# Patient Record
Sex: Female | Born: 1975 | Race: White | Hispanic: No | Marital: Single | Smoking: Never smoker
Health system: Southern US, Community
[De-identification: ages and names within clinical notes are randomized; demographics above are authoritative.]

## PROBLEM LIST (undated history)

## (undated) ENCOUNTER — Inpatient Hospital Stay (HOSPITAL_COMMUNITY): Payer: Self-pay

## (undated) DIAGNOSIS — M545 Low back pain, unspecified: Secondary | ICD-10-CM

## (undated) DIAGNOSIS — E538 Deficiency of other specified B group vitamins: Secondary | ICD-10-CM

## (undated) DIAGNOSIS — R32 Unspecified urinary incontinence: Secondary | ICD-10-CM

## (undated) DIAGNOSIS — N393 Stress incontinence (female) (male): Secondary | ICD-10-CM

## (undated) DIAGNOSIS — R200 Anesthesia of skin: Secondary | ICD-10-CM

## (undated) DIAGNOSIS — M7710 Lateral epicondylitis, unspecified elbow: Secondary | ICD-10-CM

## (undated) DIAGNOSIS — I340 Nonrheumatic mitral (valve) insufficiency: Secondary | ICD-10-CM

## (undated) DIAGNOSIS — F419 Anxiety disorder, unspecified: Secondary | ICD-10-CM

## (undated) DIAGNOSIS — E559 Vitamin D deficiency, unspecified: Secondary | ICD-10-CM

## (undated) DIAGNOSIS — F329 Major depressive disorder, single episode, unspecified: Secondary | ICD-10-CM

## (undated) DIAGNOSIS — R202 Paresthesia of skin: Secondary | ICD-10-CM

## (undated) DIAGNOSIS — I5032 Chronic diastolic (congestive) heart failure: Secondary | ICD-10-CM

## (undated) DIAGNOSIS — F32A Depression, unspecified: Secondary | ICD-10-CM

## (undated) DIAGNOSIS — E785 Hyperlipidemia, unspecified: Secondary | ICD-10-CM

## (undated) DIAGNOSIS — I80209 Phlebitis and thrombophlebitis of unspecified deep vessels of unspecified lower extremity: Secondary | ICD-10-CM

## (undated) HISTORY — DX: Paresthesia of skin: R20.0

## (undated) HISTORY — DX: Paresthesia of skin: R20.2

## (undated) HISTORY — DX: Deficiency of other specified B group vitamins: E53.8

## (undated) HISTORY — PX: INTESTINAL MALROTATION REPAIR: SHX411

## (undated) HISTORY — DX: Vitamin D deficiency, unspecified: E55.9

## (undated) HISTORY — DX: Chronic diastolic (congestive) heart failure: I50.32

## (undated) HISTORY — DX: Major depressive disorder, single episode, unspecified: F32.9

## (undated) HISTORY — DX: Stress incontinence (female) (male): N39.3

## (undated) HISTORY — DX: Anxiety disorder, unspecified: F41.9

## (undated) HISTORY — DX: Phlebitis and thrombophlebitis of unspecified deep vessels of unspecified lower extremity: I80.209

## (undated) HISTORY — PX: DERMOID CYST  EXCISION: SHX1452

## (undated) HISTORY — PX: CHOLECYSTECTOMY: SHX55

## (undated) HISTORY — DX: Depression, unspecified: F32.A

## (undated) HISTORY — DX: Low back pain, unspecified: M54.50

## (undated) HISTORY — DX: Lateral epicondylitis, unspecified elbow: M77.10

## (undated) HISTORY — DX: Hyperlipidemia, unspecified: E78.5

## (undated) HISTORY — DX: Nonrheumatic mitral (valve) insufficiency: I34.0

## (undated) HISTORY — PX: OTHER SURGICAL HISTORY: SHX169

## (undated) HISTORY — PX: APPENDECTOMY: SHX54

---
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
## 2006-08-09 ENCOUNTER — Emergency Department (HOSPITAL_COMMUNITY): Admission: EM | Admit: 2006-08-09 | Discharge: 2006-08-09 | Payer: Self-pay | Admitting: Emergency Medicine

## 2006-09-02 ENCOUNTER — Ambulatory Visit: Payer: Self-pay | Admitting: Gastroenterology

## 2006-09-15 ENCOUNTER — Ambulatory Visit: Payer: Self-pay | Admitting: Gastroenterology

## 2007-12-13 ENCOUNTER — Emergency Department: Payer: Self-pay | Admitting: Emergency Medicine

## 2008-03-03 ENCOUNTER — Observation Stay (HOSPITAL_COMMUNITY): Admission: EM | Admit: 2008-03-03 | Discharge: 2008-03-03 | Payer: Self-pay | Admitting: Emergency Medicine

## 2010-04-23 LAB — POCT I-STAT, CHEM 8
Calcium, Ion: 1.04 mmol/L — ABNORMAL LOW (ref 1.12–1.32)
Creatinine, Ser: 0.7 mg/dL (ref 0.4–1.2)
Glucose, Bld: 140 mg/dL — ABNORMAL HIGH (ref 70–99)
HCT: 42 % (ref 36.0–46.0)
Hemoglobin: 14.3 g/dL (ref 12.0–15.0)

## 2010-04-23 LAB — URINALYSIS, ROUTINE W REFLEX MICROSCOPIC
Ketones, ur: 15 mg/dL — AB
Leukocytes, UA: NEGATIVE
Nitrite: NEGATIVE
Protein, ur: NEGATIVE mg/dL
Urobilinogen, UA: 1 mg/dL (ref 0.0–1.0)

## 2010-04-23 LAB — URINE MICROSCOPIC-ADD ON

## 2010-04-23 LAB — HEPATIC FUNCTION PANEL
Bilirubin, Direct: 0.3 mg/dL (ref 0.0–0.3)
Indirect Bilirubin: 0.6 mg/dL (ref 0.3–0.9)
Total Bilirubin: 0.9 mg/dL (ref 0.3–1.2)

## 2010-06-09 ENCOUNTER — Emergency Department (HOSPITAL_COMMUNITY)
Admission: EM | Admit: 2010-06-09 | Discharge: 2010-06-09 | Disposition: A | Discharge status: Home / Self Care | Payer: Medicaid Other | Attending: Emergency Medicine | Admitting: Emergency Medicine

## 2010-06-09 DIAGNOSIS — Z0000 Encounter for general adult medical examination without abnormal findings: Secondary | ICD-10-CM | POA: Insufficient documentation

## 2010-06-09 DIAGNOSIS — R4182 Altered mental status, unspecified: Secondary | ICD-10-CM | POA: Insufficient documentation

## 2010-06-09 DIAGNOSIS — F1210 Cannabis abuse, uncomplicated: Secondary | ICD-10-CM | POA: Insufficient documentation

## 2010-10-14 ENCOUNTER — Ambulatory Visit: Payer: Self-pay

## 2010-10-21 LAB — URINALYSIS, ROUTINE W REFLEX MICROSCOPIC
Hgb urine dipstick: NEGATIVE
Ketones, ur: NEGATIVE
Protein, ur: NEGATIVE
Urobilinogen, UA: 0.2

## 2010-10-21 LAB — HEPATIC FUNCTION PANEL
ALT: 39 — ABNORMAL HIGH
Bilirubin, Direct: <0.1
Total Protein: 7.8

## 2010-10-21 LAB — LIPASE, BLOOD: Lipase: 24

## 2010-10-21 LAB — I-STAT 8, (EC8 V) (CONVERTED LAB)
Acid-Base Excess: 2
Bicarbonate: 28.5 — ABNORMAL HIGH
Chloride: 103
HCT: 43
Operator id: 277751
TCO2: 30
pCO2, Ven: 53 — ABNORMAL HIGH
pH, Ven: 7.338 — ABNORMAL HIGH

## 2010-10-21 LAB — POCT I-STAT CREATININE
Creatinine, Ser: 0.7
Operator id: 277751

## 2010-10-21 LAB — DIFFERENTIAL
Basophils Absolute: 0
Basophils Relative: 1
Eosinophils Relative: 3
Lymphocytes Relative: 39
Monocytes Absolute: 0.5

## 2010-10-21 LAB — PREGNANCY, URINE: Preg Test, Ur: NEGATIVE

## 2010-10-21 LAB — CBC
HCT: 41.1
Hemoglobin: 14
MCHC: 34
Platelets: 314
RDW: 13.5

## 2010-11-07 ENCOUNTER — Ambulatory Visit: Payer: Self-pay

## 2010-11-28 ENCOUNTER — Observation Stay: Payer: Self-pay | Admitting: Obstetrics and Gynecology

## 2010-12-03 ENCOUNTER — Inpatient Hospital Stay: Payer: Self-pay

## 2010-12-06 LAB — PATHOLOGY REPORT

## 2011-11-03 ENCOUNTER — Emergency Department: Payer: Self-pay | Admitting: Emergency Medicine

## 2011-11-26 ENCOUNTER — Other Ambulatory Visit: Payer: Self-pay | Admitting: Neurological Surgery

## 2011-11-26 DIAGNOSIS — M545 Low back pain: Secondary | ICD-10-CM

## 2011-12-02 ENCOUNTER — Ambulatory Visit
Admission: RE | Admit: 2011-12-02 | Discharge: 2011-12-02 | Disposition: A | Discharge status: Home / Self Care | Payer: Medicaid Other | Source: Ambulatory Visit | Attending: Neurological Surgery | Admitting: Neurological Surgery

## 2011-12-02 DIAGNOSIS — M545 Low back pain: Secondary | ICD-10-CM

## 2012-03-21 ENCOUNTER — Emergency Department: Payer: Self-pay | Admitting: Unknown Physician Specialty

## 2012-03-21 LAB — CBC
HCT: 38.5 % (ref 35.0–47.0)
HGB: 13.1 g/dL (ref 12.0–16.0)
MCH: 28.2 pg (ref 26.0–34.0)
RDW: 12.7 % (ref 11.5–14.5)
WBC: 9.1 10*3/uL (ref 3.6–11.0)

## 2012-03-21 LAB — BASIC METABOLIC PANEL
BUN: 10 mg/dL (ref 7–18)
Calcium, Total: 8.8 mg/dL (ref 8.5–10.1)
Creatinine: 0.56 mg/dL — ABNORMAL LOW (ref 0.60–1.30)
EGFR (African American): >60
EGFR (Non-African Amer.): >60
Glucose: 102 mg/dL — ABNORMAL HIGH (ref 65–99)
Potassium: 3.4 mmol/L — ABNORMAL LOW (ref 3.5–5.1)

## 2012-03-21 LAB — CK TOTAL AND CKMB (NOT AT ARMC)
CK, Total: 81 U/L (ref 21–215)
CK-MB: 1 ng/mL (ref 0.5–3.6)

## 2012-10-18 ENCOUNTER — Ambulatory Visit: Payer: Self-pay | Admitting: Obstetrics and Gynecology

## 2012-10-18 LAB — HEMOGLOBIN: HGB: 13.1 g/dL (ref 12.0–16.0)

## 2012-10-28 ENCOUNTER — Ambulatory Visit: Payer: Self-pay | Admitting: Obstetrics and Gynecology

## 2012-10-29 LAB — PATHOLOGY REPORT

## 2012-12-15 ENCOUNTER — Ambulatory Visit: Payer: Self-pay | Admitting: Nurse Practitioner

## 2013-06-11 ENCOUNTER — Emergency Department: Payer: Self-pay | Admitting: Emergency Medicine

## 2013-06-11 LAB — URINALYSIS, COMPLETE
BACTERIA: NONE SEEN
BILIRUBIN, UR: NEGATIVE
BLOOD: NEGATIVE
GLUCOSE, UR: NEGATIVE mg/dL (ref 0–75)
Ketone: NEGATIVE
Leukocyte Esterase: NEGATIVE
Nitrite: NEGATIVE
Ph: 5 (ref 4.5–8.0)
Protein: NEGATIVE
Specific Gravity: 1.023 (ref 1.003–1.030)
Squamous Epithelial: 4

## 2013-06-11 LAB — CBC WITH DIFFERENTIAL/PLATELET
Basophil #: 0.1 10*3/uL (ref 0.0–0.1)
Basophil %: 0.8 %
Eosinophil #: 0.1 10*3/uL (ref 0.0–0.7)
Eosinophil %: 1.6 %
HCT: 40.9 % (ref 35.0–47.0)
HGB: 13.8 g/dL (ref 12.0–16.0)
LYMPHS PCT: 27.7 %
Lymphocyte #: 2.4 10*3/uL (ref 1.0–3.6)
MCH: 29.3 pg (ref 26.0–34.0)
MCHC: 33.7 g/dL (ref 32.0–36.0)
MCV: 87 fL (ref 80–100)
Monocyte #: 0.5 x10 3/mm (ref 0.2–0.9)
Monocyte %: 6 %
NEUTROS ABS: 5.5 10*3/uL (ref 1.4–6.5)
NEUTROS PCT: 63.9 %
Platelet: 292 10*3/uL (ref 150–440)
RBC: 4.7 10*6/uL (ref 3.80–5.20)
RDW: 13.4 % (ref 11.5–14.5)
WBC: 8.6 10*3/uL (ref 3.6–11.0)

## 2013-06-11 LAB — COMPREHENSIVE METABOLIC PANEL
ALBUMIN: 3.6 g/dL (ref 3.4–5.0)
ALK PHOS: 70 U/L
ALT: 22 U/L (ref 12–78)
AST: 9 U/L — AB (ref 15–37)
Anion Gap: 8 (ref 7–16)
BUN: 11 mg/dL (ref 7–18)
Bilirubin,Total: 0.3 mg/dL (ref 0.2–1.0)
CHLORIDE: 103 mmol/L (ref 98–107)
CO2: 27 mmol/L (ref 21–32)
CREATININE: 0.61 mg/dL (ref 0.60–1.30)
Calcium, Total: 8.6 mg/dL (ref 8.5–10.1)
EGFR (African American): >60
Glucose: 100 mg/dL — ABNORMAL HIGH (ref 65–99)
OSMOLALITY: 275 (ref 275–301)
Potassium: 3.4 mmol/L — ABNORMAL LOW (ref 3.5–5.1)
SODIUM: 138 mmol/L (ref 136–145)
Total Protein: 7.5 g/dL (ref 6.4–8.2)

## 2013-06-11 LAB — HCG, QUANTITATIVE, PREGNANCY: BETA HCG, QUANT.: 284 m[IU]/mL — AB

## 2013-06-13 ENCOUNTER — Emergency Department: Payer: Self-pay | Admitting: Emergency Medicine

## 2013-06-13 LAB — HCG, QUANTITATIVE, PREGNANCY: Beta Hcg, Quant.: 687 m[IU]/mL — ABNORMAL HIGH

## 2013-06-16 ENCOUNTER — Emergency Department: Payer: Self-pay

## 2013-06-16 LAB — CBC WITH DIFFERENTIAL/PLATELET
Basophil #: 0.1 10*3/uL (ref 0.0–0.1)
Basophil %: 0.7 %
EOS PCT: 1.3 %
Eosinophil #: 0.1 10*3/uL (ref 0.0–0.7)
HCT: 38.9 % (ref 35.0–47.0)
HGB: 13.3 g/dL (ref 12.0–16.0)
Lymphocyte #: 2.2 10*3/uL (ref 1.0–3.6)
Lymphocyte %: 28.6 %
MCH: 29.7 pg (ref 26.0–34.0)
MCHC: 34.1 g/dL (ref 32.0–36.0)
MCV: 87 fL (ref 80–100)
MONOS PCT: 5.3 %
Monocyte #: 0.4 x10 3/mm (ref 0.2–0.9)
Neutrophil #: 5 10*3/uL (ref 1.4–6.5)
Neutrophil %: 64.1 %
Platelet: 276 10*3/uL (ref 150–440)
RBC: 4.47 10*6/uL (ref 3.80–5.20)
RDW: 13.1 % (ref 11.5–14.5)
WBC: 7.8 10*3/uL (ref 3.6–11.0)

## 2013-06-16 LAB — COMPREHENSIVE METABOLIC PANEL
ALBUMIN: 3.6 g/dL (ref 3.4–5.0)
AST: 20 U/L (ref 15–37)
Alkaline Phosphatase: 61 U/L
Anion Gap: 5 — ABNORMAL LOW (ref 7–16)
BUN: 8 mg/dL (ref 7–18)
Bilirubin,Total: 0.4 mg/dL (ref 0.2–1.0)
CALCIUM: 8.6 mg/dL (ref 8.5–10.1)
CO2: 26 mmol/L (ref 21–32)
Chloride: 106 mmol/L (ref 98–107)
Creatinine: 0.83 mg/dL (ref 0.60–1.30)
EGFR (African American): >60
EGFR (Non-African Amer.): >60
Glucose: 107 mg/dL — ABNORMAL HIGH (ref 65–99)
Osmolality: 273 (ref 275–301)
Potassium: 3.5 mmol/L (ref 3.5–5.1)
SGPT (ALT): 21 U/L (ref 12–78)
Sodium: 137 mmol/L (ref 136–145)
Total Protein: 7.1 g/dL (ref 6.4–8.2)

## 2013-06-16 LAB — URINALYSIS, COMPLETE
BLOOD: NEGATIVE
Bilirubin,UR: NEGATIVE
Glucose,UR: NEGATIVE mg/dL (ref 0–75)
KETONE: NEGATIVE
NITRITE: NEGATIVE
PROTEIN: NEGATIVE
Ph: 5 (ref 4.5–8.0)
RBC,UR: <1 /HPF (ref 0–5)
Specific Gravity: 1.019 (ref 1.003–1.030)
Squamous Epithelial: 13

## 2013-06-16 LAB — GC/CHLAMYDIA PROBE AMP

## 2013-06-16 LAB — HCG, QUANTITATIVE, PREGNANCY: Beta Hcg, Quant.: 2306 m[IU]/mL — ABNORMAL HIGH

## 2013-06-16 LAB — WET PREP, GENITAL

## 2013-08-01 ENCOUNTER — Ambulatory Visit: Payer: Self-pay | Admitting: Nurse Practitioner

## 2013-08-03 ENCOUNTER — Encounter (HOSPITAL_COMMUNITY): Payer: Self-pay | Admitting: *Deleted

## 2013-08-03 ENCOUNTER — Inpatient Hospital Stay (HOSPITAL_COMMUNITY)
Admission: AD | Admit: 2013-08-03 | Discharge: 2013-08-04 | Disposition: A | Discharge status: Home / Self Care | Payer: Medicaid Other | Source: Ambulatory Visit | Attending: Obstetrics & Gynecology | Admitting: Obstetrics & Gynecology

## 2013-08-03 DIAGNOSIS — O219 Vomiting of pregnancy, unspecified: Secondary | ICD-10-CM

## 2013-08-03 DIAGNOSIS — R51 Headache: Secondary | ICD-10-CM | POA: Insufficient documentation

## 2013-08-03 DIAGNOSIS — O210 Mild hyperemesis gravidarum: Secondary | ICD-10-CM | POA: Diagnosis present

## 2013-08-03 DIAGNOSIS — K5900 Constipation, unspecified: Secondary | ICD-10-CM | POA: Insufficient documentation

## 2013-08-03 NOTE — MAU Note (Signed)
Vomiting with preg

## 2013-08-04 ENCOUNTER — Encounter (HOSPITAL_COMMUNITY): Payer: Self-pay | Admitting: *Deleted

## 2013-08-04 DIAGNOSIS — O219 Vomiting of pregnancy, unspecified: Secondary | ICD-10-CM

## 2013-08-04 LAB — URINALYSIS, ROUTINE W REFLEX MICROSCOPIC
BILIRUBIN URINE: NEGATIVE
Glucose, UA: NEGATIVE mg/dL
Hgb urine dipstick: NEGATIVE
Ketones, ur: 40 mg/dL — AB
Leukocytes, UA: NEGATIVE
NITRITE: NEGATIVE
Protein, ur: NEGATIVE mg/dL
SPECIFIC GRAVITY, URINE: 1.015 (ref 1.005–1.030)
UROBILINOGEN UA: 0.2 mg/dL (ref 0.0–1.0)
pH: 8.5 — ABNORMAL HIGH (ref 5.0–8.0)

## 2013-08-04 LAB — POCT PREGNANCY, URINE: PREG TEST UR: POSITIVE — AB

## 2013-08-04 MED ORDER — PROMETHAZINE HCL 25 MG/ML IJ SOLN
25.0000 mg | Freq: Once | INTRAVENOUS | Status: DC
  Filled 2013-08-04: qty 1

## 2013-08-04 MED ORDER — DEXTROSE 5 % IN LACTATED RINGERS IV BOLUS: 1000.0000 mL | Freq: Once | INTRAVENOUS | Status: DC

## 2013-08-04 MED ORDER — ONDANSETRON 8 MG PO TBDP: 8.0000 mg | ORAL_TABLET | Freq: Three times a day (TID) | ORAL | Status: DC | PRN

## 2013-08-04 MED ORDER — ONDANSETRON 8 MG/NS 50 ML IVPB: 8.0000 mg | Freq: Once | INTRAVENOUS | Status: DC

## 2013-08-04 MED ORDER — PROMETHAZINE HCL 25 MG/ML IJ SOLN
25.0000 mg | Freq: Once | INTRAVENOUS | Status: DC
  Administered 2013-08-04: 25 mg via INTRAVENOUS
  Filled 2013-08-04: qty 1

## 2013-08-04 MED ORDER — DOCUSATE SODIUM 100 MG PO CAPS: 100.0000 mg | ORAL_CAPSULE | Freq: Two times a day (BID) | ORAL | Status: DC

## 2013-08-04 NOTE — MAU Provider Note (Signed)
History     CSN: [id number]  Arrival date and time: 08/03/13 2303   First Provider Initiated Contact with Patient 08/04/13 0019      Chief Complaint  Patient presents with  . Hyperemesis Gravidarum  . Constipation  . Headache   HPI  [name] Velazquez is a 38 y.o. [id number] at [redacted]w[redacted]d who presents today with nausea and vomiting. She is getting prenatal care at [facility]. She has been taking phenergan, but it has not been helping. She states that she has vomited about 4 times today, and has not been able to eat anything today. She has not had a BM in about 5 days either.   Past Medical History  Diagnosis Date  . Medical history non-contributory     Past Surgical History  Procedure Laterality Date  . Appendectomy    . Cholecystectomy    . Abdominal surgery      History reviewed. No pertinent family history.  History  Substance Use Topics  . Smoking status: Never Smoker   . Smokeless tobacco: Not on file  . Alcohol Use: No    Allergies: No Known Allergies  Prescriptions prior to admission  Medication Sig Dispense Refill  . magnesium citrate SOLN Take 296 mLs by mouth once.      . promethazine (PHENERGAN) 25 MG suppository Place 25 mg rectally every 6 (six) hours as needed for nausea or vomiting.      . promethazine (PHENERGAN) 25 MG tablet Take 25 mg by mouth every 6 (six) hours as needed for nausea or vomiting.        ROS Physical Exam   Blood pressure 123/80, pulse 85, temperature 99 F (37.2 C), temperature source Oral, resp. rate 16, height 5\' 2"  (1.575 m), weight 98.431 kg (217 lb), SpO2 100.00%.  Physical Exam  Nursing note and vitals reviewed. Constitutional: She is oriented to person, place, and time. She appears well-developed and well-nourished. No distress.  Cardiovascular: Normal rate.   Respiratory: Effort normal.  GI: Soft. There is no tenderness. There is no rebound.  Neurological: She is alert and oriented to person, place, and time.  Skin:  Skin is warm and dry.  Psychiatric: She has a normal mood and affect.    MAU Course  Procedures  Results for orders placed during the hospital encounter of 08/03/13 (from the past 24 hour(s))  URINALYSIS, ROUTINE W REFLEX MICROSCOPIC     Status: Abnormal   Collection Time    08/03/13 11:30 PM      Result Value Ref Range   Color, Urine YELLOW  YELLOW   APPearance HAZY (*) CLEAR   Specific Gravity, Urine 1.015  1.005 - 1.030   pH 8.5 (*) 5.0 - 8.0   Glucose, UA NEGATIVE  NEGATIVE mg/dL   Hgb urine dipstick NEGATIVE  NEGATIVE   Bilirubin Urine NEGATIVE  NEGATIVE   Ketones, ur 40 (*) NEGATIVE mg/dL   Protein, ur NEGATIVE  NEGATIVE mg/dL   Urobilinogen, UA 0.2  0.0 - 1.0 mg/dL   Nitrite NEGATIVE  NEGATIVE   Leukocytes, UA NEGATIVE  NEGATIVE   0216: Patient has been able to tolerate PO food and fluids   Assessment and Plan   1. Nausea/vomiting in pregnancy    Comfort measures reviewed Return to MAU as needed  Follow-up Information   Follow up with Consuella Lose . (As scheduled)    Contact information:   [address] [place], [place] [address] Phone [phone]        Mathews Robinsons,  Franco ColletHeather Donovan 08/04/2013, 12:20 AM

## 2013-08-04 NOTE — Discharge Instructions (Signed)

## 2013-08-06 ENCOUNTER — Ambulatory Visit: Payer: Self-pay | Admitting: Nurse Practitioner

## 2013-08-09 NOTE — MAU Provider Note (Signed)
Attestation of Attending Supervision of Advanced Practitioner: Evaluation and management procedures were performed by the PA/NP/CNM/OB Fellow under my supervision/collaboration. Chart reviewed and agree with management and plan.  Denver Bentson V 08/09/2013 7:14 AM    

## 2013-09-23 ENCOUNTER — Encounter (HOSPITAL_COMMUNITY): Payer: Self-pay | Admitting: *Deleted

## 2013-09-23 ENCOUNTER — Inpatient Hospital Stay (HOSPITAL_COMMUNITY)
Admission: AD | Admit: 2013-09-23 | Discharge: 2013-09-23 | Disposition: A | Discharge status: Home / Self Care | Payer: Medicaid Other | Source: Ambulatory Visit | Attending: Obstetrics and Gynecology | Admitting: Obstetrics and Gynecology

## 2013-09-23 DIAGNOSIS — O99340 Other mental disorders complicating pregnancy, unspecified trimester: Secondary | ICD-10-CM | POA: Diagnosis not present

## 2013-09-23 DIAGNOSIS — F418 Other specified anxiety disorders: Secondary | ICD-10-CM

## 2013-09-23 DIAGNOSIS — O26899 Other specified pregnancy related conditions, unspecified trimester: Secondary | ICD-10-CM

## 2013-09-23 DIAGNOSIS — F411 Generalized anxiety disorder: Secondary | ICD-10-CM | POA: Diagnosis present

## 2013-09-23 DIAGNOSIS — R109 Unspecified abdominal pain: Secondary | ICD-10-CM | POA: Diagnosis not present

## 2013-09-23 LAB — URINALYSIS, ROUTINE W REFLEX MICROSCOPIC
BILIRUBIN URINE: NEGATIVE
GLUCOSE, UA: NEGATIVE mg/dL
KETONES UR: NEGATIVE mg/dL
LEUKOCYTES UA: NEGATIVE
Nitrite: NEGATIVE
PROTEIN: NEGATIVE mg/dL
Specific Gravity, Urine: 1.015 (ref 1.005–1.030)
Urobilinogen, UA: 0.2 mg/dL (ref 0.0–1.0)
pH: 5.5 (ref 5.0–8.0)

## 2013-09-23 LAB — URINE MICROSCOPIC-ADD ON
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
## 2013-09-23 NOTE — MAU Note (Signed)
Patient states she has been under a lot of stress for the past 2 days and feels anxious. Feels like she had an emotional breakdown. States she has abdominal cramping, denies bleeding or discharge. Patient gets her prenatal care in [place].

## 2013-09-23 NOTE — MAU Provider Note (Signed)
Attestation of Attending Supervision of Advanced Practitioner (CNM/NP): Evaluation and management procedures were performed by the Advanced Practitioner under my supervision and collaboration.  I have reviewed the Advanced Practitioner's note and chart, and I agree with the management and plan.  Chastin Riesgo 09/23/2013 7:57 PM

## 2013-09-23 NOTE — Discharge Instructions (Signed)
It is important that you follow up with a therapist to prescribe you medications for your anxiety. I did speak with Dr. Jolayne Panther and she says that they are accepting new patients in the [facility] office here at [facility]. You will need to call to schedule an appointment. If you do keep your appointment for Monday with your OB they may be able to refer you to a therapist or if you follow up here the staff in the [facility] office can arrange an appointment for you.
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
## 2013-11-07 ENCOUNTER — Encounter (HOSPITAL_COMMUNITY): Payer: Self-pay | Admitting: *Deleted

## 2014-02-13 ENCOUNTER — Ambulatory Visit: Payer: Self-pay | Admitting: Obstetrics and Gynecology

## 2014-02-13 LAB — CBC WITH DIFFERENTIAL/PLATELET
Basophil #: 0 10*3/uL (ref 0.0–0.1)
Basophil %: 0.2 %
EOS ABS: 0.1 10*3/uL (ref 0.0–0.7)
Eosinophil %: 1.3 %
HCT: 32.8 % — ABNORMAL LOW (ref 35.0–47.0)
HGB: 11 g/dL — ABNORMAL LOW (ref 12.0–16.0)
LYMPHS PCT: 18.7 %
Lymphocyte #: 1.4 10*3/uL (ref 1.0–3.6)
MCH: 27.3 pg (ref 26.0–34.0)
MCHC: 33.6 g/dL (ref 32.0–36.0)
MCV: 81 fL (ref 80–100)
MONO ABS: 0.5 x10 3/mm (ref 0.2–0.9)
MONOS PCT: 6.7 %
Neutrophil #: 5.6 10*3/uL (ref 1.4–6.5)
Neutrophil %: 73.1 %
PLATELETS: 195 10*3/uL (ref 150–440)
RBC: 4.03 10*6/uL (ref 3.80–5.20)
RDW: 13.8 % (ref 11.5–14.5)
WBC: 7.7 10*3/uL (ref 3.6–11.0)

## 2014-02-14 ENCOUNTER — Inpatient Hospital Stay: Payer: Self-pay | Admitting: Obstetrics & Gynecology

## 2014-04-28 NOTE — Op Note (Signed)
PATIENT NAMArtemio Velazquez:  [name] Velazquez, [name] Velazquez MR#:  [id number] DATE OF BIRTH:  30-Dec-1975  DATE OF PROCEDURE:  10/28/2012  PREOPERATIVE DIAGNOSES: 1.  Ovarian cysts. 2.  Pelvic pain.  POSTOPERATIVE DIAGNOSES: 1.  Ovarian cysts. 2.  Pelvic pain. 3.  Postoperative evidence of endometriosis. 4.  Cyst consistent with a functional cyst or serous cyst.  OPERATION PERFORMED:  Laparoscopic right ovarian cystectomy.   ANESTHESIA USED: General.   PRIMARY SURGEON: Vena AustriaAndreas Jelissa Espiritu, MD   ESTIMATED BLOOD LOSS: Minimal.  OPERATIVE FLUIDS: 1 liter of crystalloid.  URINE OUTPUT: 400 mL of clear urine.  COMPLICATIONS: None.   INTRAOPERATIVE FINDINGS: The right ovary and tube adhesed to pelvic sidewall with thin,  filmy adhesions.  Some evidence of possible endometriosis in the right pelvic wall versus reactive changes.  Both tubes grossly normal in appearance. Left ovary was normal/ Right ovary contained two simple serous cysts.   SPECIMENS REMOVED:  Portions of right ovarian cyst wall, right pelvic sidewall, peritoneal biopsies.    CONDITION FOLLOWING PROCEDURE: Stable.  PROCEDURE IN DETAIL: The risks, benefits, and alternatives of the procedure were discussed with the patient prior to proceeding to the operating room.  The patient was taken to the operating room, where she was placed under general endotracheal anesthesia. She was prepped and draped in the usual sterile fashion, positioned in the dorsal lithotomy position.   Attention was turned to the patient's pelvis. An operative speculum was placed. The cervix was visualized.  The anterior lip grasped with a single-tooth tenaculum.  A Hulka tenaculum was then placed, allowing manipulation of the uterus.The single tooth tenaculum and speculum were removed. The patient's bladder was previously drained with a red rubber straight catheter.   Attention was turned to the patient's abdomen.  The umbilicus was infiltrated with 1% lidocaine.  A stab incision was  then made at the base of the umbilicus, entry to  the peritoneal cavity was obtained using direct visualization and an XL trocar.  Once a pneumoperitoneum had been established, a 10 mm left assistant port was placed under direct visualization, and a right 5 mm assistant port. Inspection of the abdomen and pelvis revealed the above findings.  The adhesions of the ovary to the sidewall and to the tube were freed up bluntly.  Once the ovary was mobilized, the cyst on the right ovary was incised, using the 5 mm Harmonic. The cyst wall was then removed.  Following removal of the cyst wall, the surgical bed was noted to be hemostatic.  There was some evidence of endometriosis involving the right pelvic sidewall, several slightly rust-colored lesions were dispersed throughout the right sidewall. Several of these were biopsied and sent to pathology.  Following this, the pneumoperitoneum was evacuated. All port sites were removed. The 10 mm port site was closed with a 4-0 Monocryl in subcuticular fashion. The remaining port sites were dressed with Dermabond. Sponge, needle, and instrument counts were correct x2. The patient tolerated the procedure well, and was taken to the recovery room in stable condition.   ____________________________ Florina OuAndreas M. Bonney AidStaebler, MD ams:cg D: 10/31/2012 22:43:00 ET T: 11/01/2012 03:58:06 ET JOB#: [id number]  cc: Florina OuAndreas M. Bonney AidStaebler, MD, <Dictator> Carmel SacramentoANDREAS Cathrine MusterM Emmaleah Meroney MD ELECTRONICALLY SIGNED 11/17/2012 8:56

## 2014-05-01 LAB — SURGICAL PATHOLOGY

## 2014-05-03 ENCOUNTER — Encounter: Payer: Self-pay | Admitting: Gastroenterology

## 2014-05-07 NOTE — Op Note (Signed)
PATIENT NAMArtemio Aly:  Velazquez, Velazquez MR#:  [id number] DATE OF BIRTH:  1975-08-04  DATE OF PROCEDURE:  02/14/2014  PREOPERATIVE DIAGNOSES: 761.  A 39 year old gravida 5, para 4-0-0-4 at 40 weeks, 0 days gestation. 2.  History of prior low transverse cesarean section.  3.  Gestational diabetes.  4.  Morbid obesity with body mass index 40. 5.  Desires permanent surgical sterilization.   POSTOPERATIVE DIAGNOSES: 241.  A 39 year old gravida 5, para 4-0-0-4 at 40 weeks, 0 days gestation. 2.  History of prior low transverse cesarean section.  3.  Gestational diabetes.  4.  Morbid obesity with body mass index 40. 5.  Desires permanent surgical sterilization.   PROCEDURE PERFORMED: Repeat low transverse cesarean section via Pfannenstiel skin incision and bilateral tubal ligation with Pomeroy method.   ANESTHESIA USED: Spinal.   PRIMARY SURGEON: Vena AustriaAndreas Kismet Facemire, MD  ASSISTANT: Chelsea Ward, MD  PREOPERATIVE ANTIBIOTICS: 2 grams Ancef.   ESTIMATED BLOOD LOSS: 800 mL.  OPERATIVE FLUIDS: 1500 mL of crystalloid.   URINE OUTPUT: 25 mL.   COMPLICATIONS: None.   SPECIMENS REMOVED: Portion of right and left tube.   INTRAOPERATIVE FINDINGS: Normal tubes, ovaries, and uterus. A nuchal cord x1 was encountered during delivery of the infant. Delivery resulted in the birth of a liveborn female infant weighing 3450 grams, 7 pounds 10 ounces, Apgars 9 and 9.   PATIENT CONDITION FOLLOWING PROCEDURE: Stable.   PROCEDURE IN DETAIL: Risks, benefits, and alternatives of the procedure were discussed with the patient prior to proceeding to the operating room. The patient was taken to the operating room where she was placed under spinal anesthesia. She was positioned in the supine position and prepped and draped in the usual sterile fashion. The level of anesthetic was checked prior to proceeding with the case and a timeout was performed. A Pfannenstiel skin incision was made 2 cm above the pubic symphysis utilizing  the patient's previous existing scar. This was carried down sharply to the level of the rectus fascia. The fascia was incised in the midline using the scalpel. The fascial incision was extended using Mayo scissors. Superior border was grasped with 2 Kocher clamps and the fascia was dissected off the underlying rectus muscles using a combination of blunt dissection and sharp dissection using the scalpel. The inferior border of the rectus fascia was dissected off the rectus muscles in a similar fashion and the median raphe incised using Mayo scissors. The midline was identified and had been entered during taking down the fascia from the rectus muscles. That incision was extended using manual traction. Bladder blade was placed. A bladder flap was then created using Metzenbaum scissors, further developed digitally before replacing the bladder to displace the bladder caudally. A low transverse incision was scored on the uterus. The hysterotomy was entered using the operator's finger and then extended using manual traction. Amniotomy was performed noting clear fluid. The vertex was grasped, flexed, brought to the incision, delivered atraumatically using fundal pressure. The remainder of the infant delivered with ease. Cord was clamped and infant was suctioned before being passed to the awaiting pediatricians. The placenta was delivered using manual extraction. The uterus was exteriorized and wiped clean of clot and debris and closed using a single layer of 0 Vicryl. A portion of the right tube was identified in the mid isthmic portion, grasped with a Babcock clamp, doubly suture ligated using a 0 chromic  (Dictation Anomaly) knuckle of tube was resected using a Pomeroy method. The left portion of the tube was  also dissected in a similar fashion. The uterus was then returned to the abdomen. The hysterotomy incision was inspected and noted to be hemostatic as were both tubal segments. The rectus muscles were reapproximated  in the midline using a 2-0 Vicryl mattress stitch. The On-Q catheters were placed subfascially per the usual protocol. The fascia was closed using a looped #1 PDS in a running fashion. The subcutaneous tissue was irrigated and hemostasis achieved using the Bovie. The skin was closed using staples. Sponge, needle and instrument counts were correct x2. The patient tolerated the procedure well and was taken to the recovery room in stable condition.    ____________________________ Velazquez Ou. Bonney Aid, MD ams:sb D: 02/21/2014 08:27:28 ET T: 02/21/2014 10:15:56 ET JOB#: [id number]  cc: Velazquez Ou. Bonney Aid, MD, <Dictator> Velazquez Reid MD ELECTRONICALLY SIGNED 03/14/2014 20:58

## 2014-05-24 ENCOUNTER — Encounter: Payer: Self-pay | Admitting: Gastroenterology

## 2014-05-24 ENCOUNTER — Ambulatory Visit (INDEPENDENT_AMBULATORY_CARE_PROVIDER_SITE_OTHER): Payer: Medicaid Other | Admitting: Gastroenterology

## 2014-05-24 VITALS — BP 120/74 | HR 84 | Ht 62.0 in | Wt 213.0 lb

## 2014-05-24 DIAGNOSIS — A0472 Enterocolitis due to Clostridium difficile, not specified as recurrent: Secondary | ICD-10-CM | POA: Insufficient documentation

## 2014-05-24 DIAGNOSIS — A047 Enterocolitis due to Clostridium difficile: Secondary | ICD-10-CM

## 2014-05-24 MED ORDER — HYOSCYAMINE SULFATE 0.125 MG SL SUBL: 0.2500 mg | SUBLINGUAL_TABLET | SUBLINGUAL | Status: DC | PRN

## 2014-05-24 NOTE — Progress Notes (Signed)
    _                                                                                                                History of Present Illness:  [name] is a 39 year old white female referred at the request of Dr. Park BreedKahn for evaluation of diarrhea.  She gave birth in February, 2016.  She received antibiotics for a wound infection.  Following this she developed diarrhea, passage of mucousy stools and blood.  She apparently tested positive for C. difficile and was placed on Flagyl for 2 weeks.  When symptoms did not improve she was given another 2 week course of Flagyl, again without improvement.  Finally, she was placed on vancomycin.  Diarrhea did not subside until she completed the two-week course of vancomycin.  At this point she has rumbling in her abdomen and excess gas but no diarrhea.  There is no history of antecedent GI problems.   Past Medical History  Diagnosis Date  . Gestational diabetes   . Depression    Past Surgical History  Procedure Laterality Date  . Appendectomy    . Cholecystectomy    . Intestinal malrotation repair    . Cesarean section     family history includes Breast cancer in her maternal grandmother; Hypertension in her mother; Multiple sclerosis in her father; Obesity in her mother. Current Outpatient Prescriptions  Medication Sig Dispense Refill  . buPROPion (WELLBUTRIN XL) 150 MG 24 hr tablet Take 150 mg by mouth daily.     No current facility-administered medications for this visit.   Allergies as of 05/24/2014  . (No Known Allergies)    reports that she has never smoked. She has never used smokeless tobacco. She reports that she does not drink alcohol or use illicit drugs.   Review of Systems: Pertinent positive and negative review of systems were noted in the above HPI section. All other review of systems were otherwise negative.  Vital signs were reviewed in today's medical record Physical Exam: General: Obese female in no acute  distress Skin: anicteric Head: Normocephalic and atraumatic Eyes:  sclerae anicteric, EOMI Ears: Normal auditory acuity Mouth: No deformity or lesions Neck: Supple, no masses or thyromegaly Lymph Nodes: no lymphadenopathy Lungs: Clear throughout to auscultation Heart: Regular rate and rhythm; no murmurs, rubs or bruits Gastroinestinal: Soft, non tender and non distended. No masses, hepatosplenomegaly or hernias noted. Normal Bowel sounds Rectal:deferred Musculoskeletal: Symmetrical with no gross deformities  Skin: No lesions on visible extremities Pulses:  Normal pulses noted Extremities: No clubbing, cyanosis, edema or deformities noted Neurological: Alert oriented x 4, grossly nonfocal Cervical Nodes:  No significant cervical adenopathy Inguinal Nodes: No significant inguinal adenopathy Psychological:  Alert and cooperative. Normal mood and affect  See Assessment and Plan under Problem List

## 2014-05-24 NOTE — Assessment & Plan Note (Signed)
I history appears that she may have had pseudomembranous colitis that only responded to vancomycin rather than recurrent ear infections.  Post infectious colitis is also a possibility.  At this time she has no diarrhea although she is complaining of some abdominal discomfort and excess gas.  I would not be surprised if she develops diarrhea again.  Recommendations #1 attempt to obtain records of her previous stool studies #2 Vernona RiegerLaura Q1 tab twice a day for 2 weeks #3 hyomax when necessary #4 to consider sigmoidoscopy or repeat stool C. difficile toxin if she develops recurrent diarrhea  CC Dr. Park BreedKahn

## 2014-05-24 NOTE — Patient Instructions (Signed)
Use FloraQ over the counter 1 by mouth twice a day for 14 days Your prescription will be sent to your pharmacy  (Hyomax) Follow up as needed

## 2014-08-16 ENCOUNTER — Encounter
Admission: RE | Admit: 2014-08-16 | Discharge: 2014-08-16 | Disposition: A | Discharge status: Home / Self Care | Payer: Medicaid Other | Source: Ambulatory Visit | Attending: Obstetrics and Gynecology | Admitting: Obstetrics and Gynecology

## 2014-08-16 DIAGNOSIS — Z01812 Encounter for preprocedural laboratory examination: Secondary | ICD-10-CM | POA: Diagnosis not present

## 2014-08-16 HISTORY — DX: Unspecified urinary incontinence: R32

## 2014-08-16 NOTE — Patient Instructions (Signed)
  Your procedure is scheduled on: 08/24/14 Thurs  Report to Day Surgery. To find out your arrival time please call [phone] between 1PM - 3PM on 08/23/14 Wed.  Remember: Instructions that are not followed completely may result in serious medical risk, up to and including death, or upon the discretion of your surgeon and anesthesiologist your surgery may need to be rescheduled.    __x__ 1. Do not eat food or drink liquids after midnight. No gum chewing or hard candies.     ____ 2. No Alcohol for 24 hours before or after surgery.   ____ 3. Bring all medications with you on the day of surgery if instructed.    __x__ 4. Notify your doctor if there is any change in your medical condition     (cold, fever, infections).     Do not wear jewelry, make-up, hairpins, clips or nail polish.  Do not wear lotions, powders, or perfumes. You may wear deodorant.  Do not shave 48 hours prior to surgery. Men may shave face and neck.  Do not bring valuables to the hospital.    [facility] is not responsible for any belongings or valuables.               Contacts, dentures or bridgework may not be worn into surgery.  Leave your suitcase in the car. After surgery it may be brought to your room.  For patients admitted to the hospital, discharge time is determined by your                treatment team.   Patients discharged the day of surgery will not be allowed to drive home.   Please read over the following fact sheets that you were given:      ____ Take these medicines the morning of surgery with A SIP OF WATER:    1.   2.   3.   4.  5.  6.  ____ Fleet Enema (as directed)   ___ Use CHG Soap as directed  ____ Use inhalers on the day of surgery  ____ Stop metformin 2 days prior to surgery    ____ Take 1/2 of usual insulin dose the night before surgery and none on the morning of surgery.   ____ Stop Coumadin/Plavix/aspirin on   ____ Stop Anti-inflammatories on    ____ Stop supplements  until after surgery.    ____ Bring C-Pap to the hospital.

## 2014-08-24 ENCOUNTER — Encounter
Admission: RE | Disposition: A | Discharge status: Home / Self Care | Payer: Self-pay | Source: Ambulatory Visit | Attending: Obstetrics and Gynecology

## 2014-08-24 ENCOUNTER — Ambulatory Visit: Payer: Medicaid Other | Admitting: Anesthesiology

## 2014-08-24 ENCOUNTER — Encounter: Payer: Self-pay | Admitting: *Deleted

## 2014-08-24 ENCOUNTER — Ambulatory Visit
Admission: RE | Admit: 2014-08-24 | Discharge: 2014-08-24 | Disposition: A | Discharge status: Home / Self Care | Payer: Medicaid Other | Source: Ambulatory Visit | Attending: Obstetrics and Gynecology | Admitting: Obstetrics and Gynecology

## 2014-08-24 DIAGNOSIS — E669 Obesity, unspecified: Secondary | ICD-10-CM | POA: Diagnosis not present

## 2014-08-24 DIAGNOSIS — Z8249 Family history of ischemic heart disease and other diseases of the circulatory system: Secondary | ICD-10-CM | POA: Diagnosis not present

## 2014-08-24 DIAGNOSIS — Z6838 Body mass index (BMI) 38.0-38.9, adult: Secondary | ICD-10-CM | POA: Insufficient documentation

## 2014-08-24 DIAGNOSIS — N393 Stress incontinence (female) (male): Secondary | ICD-10-CM | POA: Diagnosis present

## 2014-08-24 DIAGNOSIS — Z9049 Acquired absence of other specified parts of digestive tract: Secondary | ICD-10-CM | POA: Diagnosis not present

## 2014-08-24 DIAGNOSIS — Z79899 Other long term (current) drug therapy: Secondary | ICD-10-CM | POA: Diagnosis not present

## 2014-08-24 HISTORY — PX: PUBOVAGINAL SLING: SHX1035

## 2014-08-24 LAB — POCT PREGNANCY, URINE: Preg Test, Ur: NEGATIVE

## 2014-08-24 SURGERY — CREATION, PUBOVAGINAL SLING
Anesthesia: General

## 2014-08-24 MED ORDER — FENTANYL CITRATE (PF) 100 MCG/2ML IJ SOLN
25.0000 ug | INTRAMUSCULAR | Status: AC | PRN
  Administered 2014-08-24 (×6): 25 ug via INTRAVENOUS

## 2014-08-24 MED ORDER — FENTANYL CITRATE (PF) 100 MCG/2ML IJ SOLN
INTRAMUSCULAR | Status: AC
  Administered 2014-08-24: 25 ug via INTRAVENOUS
  Filled 2014-08-24: qty 2

## 2014-08-24 MED ORDER — NEOMYCIN-POLYMYXIN B GU 40-200000 IR SOLN
Status: DC | PRN
  Administered 2014-08-24: 1 mL

## 2014-08-24 MED ORDER — NEOMYCIN-POLYMYXIN B GU 40-200000 IR SOLN
Status: AC
  Filled 2014-08-24: qty 1

## 2014-08-24 MED ORDER — SODIUM CHLORIDE 0.9 % IV SOLN: INTRAVENOUS | Status: DC

## 2014-08-24 MED ORDER — FENTANYL CITRATE (PF) 100 MCG/2ML IJ SOLN
INTRAMUSCULAR | Status: DC | PRN
  Administered 2014-08-24 (×3): 50 ug via INTRAVENOUS

## 2014-08-24 MED ORDER — LIDOCAINE HCL (PF) 1 % IJ SOLN
INTRAMUSCULAR | Status: AC
  Filled 2014-08-24: qty 30

## 2014-08-24 MED ORDER — OXYCODONE-ACETAMINOPHEN 5-325 MG PO TABS
ORAL_TABLET | ORAL | Status: AC
  Filled 2014-08-24: qty 2

## 2014-08-24 MED ORDER — FAMOTIDINE 20 MG PO TABS
20.0000 mg | ORAL_TABLET | Freq: Once | ORAL | Status: AC
  Administered 2014-08-24: 20 mg via ORAL

## 2014-08-24 MED ORDER — ESTROGENS, CONJUGATED 0.625 MG/GM VA CREA
TOPICAL_CREAM | VAGINAL | Status: AC
  Filled 2014-08-24: qty 30

## 2014-08-24 MED ORDER — LACTATED RINGERS IV SOLN
INTRAVENOUS | Status: DC
  Administered 2014-08-24 (×2): via INTRAVENOUS

## 2014-08-24 MED ORDER — ONDANSETRON HCL 4 MG/2ML IJ SOLN
INTRAMUSCULAR | Status: DC | PRN
  Administered 2014-08-24: 4 mg via INTRAVENOUS

## 2014-08-24 MED ORDER — LIDOCAINE HCL (CARDIAC) 20 MG/ML IV SOLN
INTRAVENOUS | Status: DC | PRN
  Administered 2014-08-24: 60 mg via INTRAVENOUS

## 2014-08-24 MED ORDER — PROPOFOL 10 MG/ML IV BOLUS
INTRAVENOUS | Status: DC | PRN
  Administered 2014-08-24: 160 mg via INTRAVENOUS

## 2014-08-24 MED ORDER — PHENYLEPHRINE HCL 10 MG/ML IJ SOLN
INTRAMUSCULAR | Status: DC | PRN
  Administered 2014-08-24 (×2): 100 ug via INTRAVENOUS

## 2014-08-24 MED ORDER — OXYCODONE-ACETAMINOPHEN 5-325 MG PO TABS
1.0000 | ORAL_TABLET | ORAL | Status: DC | PRN
  Administered 2014-08-24: 2 via ORAL

## 2014-08-24 MED ORDER — DEXAMETHASONE SODIUM PHOSPHATE 4 MG/ML IJ SOLN
INTRAMUSCULAR | Status: DC | PRN
  Administered 2014-08-24 (×2): 5 mg via INTRAVENOUS

## 2014-08-24 MED ORDER — CEFAZOLIN SODIUM-DEXTROSE 2-3 GM-% IV SOLR
2.0000 g | Freq: Once | INTRAVENOUS | Status: AC
  Administered 2014-08-24: 2 g via INTRAVENOUS

## 2014-08-24 MED ORDER — CEFAZOLIN SODIUM-DEXTROSE 2-3 GM-% IV SOLR
INTRAVENOUS | Status: AC
  Administered 2014-08-24: 2 g via INTRAVENOUS
  Filled 2014-08-24: qty 50

## 2014-08-24 MED ORDER — LIDOCAINE-EPINEPHRINE 1 %-1:100000 IJ SOLN
INTRAMUSCULAR | Status: DC | PRN
  Administered 2014-08-24: 16 mL

## 2014-08-24 MED ORDER — IBUPROFEN 600 MG PO TABS: 600.0000 mg | ORAL_TABLET | Freq: Four times a day (QID) | ORAL | Status: DC | PRN

## 2014-08-24 MED ORDER — ROCURONIUM BROMIDE 100 MG/10ML IV SOLN
INTRAVENOUS | Status: DC | PRN
  Administered 2014-08-24: 40 mg via INTRAVENOUS

## 2014-08-24 MED ORDER — FAMOTIDINE 20 MG PO TABS
ORAL_TABLET | ORAL | Status: AC
  Administered 2014-08-24: 20 mg via ORAL
  Filled 2014-08-24: qty 1

## 2014-08-24 MED ORDER — OXYCODONE-ACETAMINOPHEN 5-325 MG PO TABS: 1.0000 | ORAL_TABLET | Freq: Four times a day (QID) | ORAL | Status: DC | PRN

## 2014-08-24 MED ORDER — ONDANSETRON HCL 4 MG/2ML IJ SOLN: 4.0000 mg | Freq: Once | INTRAMUSCULAR | Status: DC | PRN

## 2014-08-24 MED ORDER — MIDAZOLAM HCL 2 MG/2ML IJ SOLN
INTRAMUSCULAR | Status: DC | PRN
  Administered 2014-08-24: 2 mg via INTRAVENOUS

## 2014-08-24 SURGICAL SUPPLY — 36 items
BAG URO DRAIN 2000ML W/SPOUT (MISCELLANEOUS) ×3 IMPLANT
BLADE SURG 15 STRL LF DISP TIS (BLADE) ×1 IMPLANT
BLADE SURG 15 STRL SS (BLADE) ×2
BNDG GAUZE 4.5X4.1 6PLY STRL (MISCELLANEOUS) IMPLANT
CANISTER SUCT 1200ML W/VALVE (MISCELLANEOUS) ×3 IMPLANT
CATH FOLEY 2WAY  5CC 16FR (CATHETERS) ×2
CATH URTH 16FR FL 2W BLN LF (CATHETERS) ×1 IMPLANT
COUNTER NEEDLE 20/40 LG (NEEDLE) ×3 IMPLANT
GLOVE BIO SURGEON STRL SZ7 (GLOVE) ×3 IMPLANT
GLOVE INDICATOR 7.5 STRL GRN (GLOVE) ×6 IMPLANT
GOWN STRL REUS W/ TWL LRG LVL3 (GOWN DISPOSABLE) ×2 IMPLANT
GOWN STRL REUS W/TWL LRG LVL3 (GOWN DISPOSABLE) ×4
HANDLE YANKAUER SUCT BULB TIP (MISCELLANEOUS) ×3 IMPLANT
IV LACTATED RINGERS 1000ML (IV SOLUTION) ×3 IMPLANT
KIT RM TURNOVER CYSTO AR (KITS) ×6 IMPLANT
LIQUID BAND (GAUZE/BANDAGES/DRESSINGS) ×6 IMPLANT
NDL SAFETY 18GX1.5 (NEEDLE) ×3 IMPLANT
NDL SAFETY 22GX1.5 (NEEDLE) ×3 IMPLANT
NS IRRIG 500ML POUR BTL (IV SOLUTION) ×3 IMPLANT
PACK DNC HYST (MISCELLANEOUS) ×3 IMPLANT
PAD GROUND ADULT SPLIT (MISCELLANEOUS) ×3 IMPLANT
PAD OB MATERNITY 4.3X12.25 (PERSONAL CARE ITEMS) ×3 IMPLANT
PAD PREP 24X41 OB/GYN DISP (PERSONAL CARE ITEMS) ×3 IMPLANT
PENCIL ELECTRO HAND CTR (MISCELLANEOUS) ×3 IMPLANT
SLING TVT EXACT (Sling) ×3 IMPLANT
SUT VIC AB 0 CT1 27 (SUTURE)
SUT VIC AB 0 CT1 27XCR 8 STRN (SUTURE) IMPLANT
SUT VIC AB 2-0 CT1 (SUTURE) IMPLANT
SUT VIC AB 3-0 SH 27 (SUTURE) ×2
SUT VIC AB 3-0 SH 27X BRD (SUTURE) ×1 IMPLANT
SUT VICRYL+ 3-0 36IN CT-1 (SUTURE) IMPLANT
SYR CONTROL 10ML (SYRINGE) ×3 IMPLANT
SYRINGE 10CC LL (SYRINGE) ×3 IMPLANT
SYRINGE IRR TOOMEY STRL 70CC (SYRINGE) ×3 IMPLANT
TUBING CONNECTING 10 (TUBING) ×2 IMPLANT
TUBING CONNECTING 10' (TUBING) ×1

## 2014-08-24 NOTE — Transfer of Care (Signed)
Immediate Anesthesia Transfer of Care Note  Patient: [name] Velazquez  Procedure(s) Performed: Procedure(s): PUBO-VAGINAL SLING (N/A)  Patient Location: PACU  Anesthesia Type:General  Level of Consciousness: awake, alert , oriented and patient cooperative  Airway & Oxygen Therapy: Patient Spontanous Breathing and Patient connected to face mask oxygen  Post-op Assessment: Report given to RN, Post -op Vital signs reviewed and stable and Patient moving all extremities X 4  Post vital signs: Reviewed and stable  Last Vitals:  Filed Vitals:   08/24/14 1109  BP: 101/81  Pulse: 102  Temp: 36.1 C  Resp: 19    Complications: No apparent anesthesia complications

## 2014-08-24 NOTE — Progress Notes (Signed)
Pt arrives from OR with oral airway intact. 

## 2014-08-24 NOTE — H&P (Signed)
Date of Initial paper H&P  History reviewed, patient examined, no change in status, stable for surgery. 

## 2014-08-24 NOTE — Progress Notes (Signed)
Pt awakened oral airway removed intact. 

## 2014-08-24 NOTE — Op Note (Signed)
Patient Name: Bhavya Eschete  Date of Procedure: 08/24/2014   Preoperative Diagnosis: 1) 39 y.o. with stress urinary incontinence  Postoperative Diagnosis: 1) 39 y.o. with stress urinary incontinence  Operation Performed: TVT and cystoscopy  Indication: Patient is a 39 year old [id number] done with child bearing with symptoms of stress urinary incontinence desiring surgical management.  Anesthesia: General  Primary Surgeon: Vena Austria, MD  Assistant: none  Preoperative Antibiotics: none  Estimated Blood Loss: 25mL  IV Fluids:  Urine Output:: ~552mL  Drains or Tubes: none  Implants: TVT exact  Specimens Removed: enone  Complications: none  Intraoperative Findings:  Cystoscopy at the conclusion of the case revealed intact bladder dome, no perforation by the TVT arms of the bladder neck or urethra.  Patient Condition: stable  Procedure in Detail:  Patient was taken to the operating room were she was administered general endotracheal anesthesia.  She was positioned in the dorsal lithotomy position utilizing Allen stirups, prepped and draped in the usual sterile fashion.  Prior to proceeding with the case a time out was performed.  Attention was turned to the patient's pelvis.  A indwelling foley catheter was used to decompress the patient's bladder.  An short weighted speculum was placed to allow visualization of the anterior vaginal wall.  An Alice clamp was placed on the anterior vaginal wall 1.5cm distal to the the urethral meatus.  The retropubic space was hydro dissected using 16mL of 1% Sensorcaine.   An incision was made with the 11 scalpel and carried cephelad for approximately 2cm from the previously placed Alice clamp.  The space between the urethra and retropubic space was further dissected using Metzenbaum scissor .  Once sufficient space had been mobilized the pubic symphysis was marked in the midline and two mark 2.5cm laterally were made on the patient  mons to guide the TVT trochar.  A guide was placed in the bladder to allow deflection of the bladder away from the trochars.  The tip of the trochar was moved along the pubic bone, staying in contact with the pubic bone as the trochar was moved through the retropubic space and towards the corresponding guide marks previously made.  Once this was accomplished bilaterally, cystoscopy was performed noting no defect in the bladder or urethra from the trochars.  The Mesh was adjusted so that it sat snugg but tension free beneath the urethra with enough room to comfortable accommodate a tonsil with some surrounding free space.  The sleeves were removed off the TVT mesh with the tonsil held in place to verify the mesh did not snug up any further.    The vaginal incision was closed using a 3-0 Monocryl in a running fashion.  Foley was removed before leaving the OR.  Sponge needle and instrument counts were correct times two.

## 2014-08-24 NOTE — Anesthesia Procedure Notes (Signed)
Procedure Name: Intubation Date/Time: 08/24/2014 9:42 AM Performed by: Michaele Offer Pre-anesthesia Checklist: Patient identified, Emergency Drugs available, Suction available and Patient being monitored Patient Re-evaluated:Patient Re-evaluated prior to inductionOxygen Delivery Method: Circle system utilized Preoxygenation: Pre-oxygenation with 100% oxygen Intubation Type: IV induction Ventilation: Oral airway inserted - appropriate to patient size and Mask ventilation with difficulty Laryngoscope Size: Mac and 3 Grade View: Grade II Tube type: Oral Tube size: 7.0 mm Number of attempts: 1 Airway Equipment and Method: Patient positioned with wedge pillow and Stylet Placement Confirmation: ETT inserted through vocal cords under direct vision,  positive ETCO2,  CO2 detector and breath sounds checked- equal and bilateral Secured at: 20 cm Tube secured with: Tape Dental Injury: Bloody posterior oropharynx  Difficulty Due To: Difficulty was unanticipated

## 2014-08-24 NOTE — Discharge Instructions (Signed)
-   Call for heavy vaginal bleeding, you may have some light bleeding or spotting - Call for inability to void, you may have been discharged with a foley catheter if you were unable to void in the hospital which will be removed at your 1 week postop visit - Call for fevers (T >100.65F) - Call for pain not covered by your prescription narcotics - No baths, showers in the next 6 weeks - No driving while taking prescription narcotics - No heavy lifting greater than 10lbs for 6 weeks as the sling heals, you also want to avoid constipation,  Any straining may affect position of the sling and long term outcome

## 2014-08-24 NOTE — Anesthesia Postprocedure Evaluation (Signed)
  Anesthesia Post-op Note  Patient: [name] Velazquez  Procedure(s) Performed: Procedure(s): PUBO-VAGINAL SLING (N/A)  Anesthesia type:General  Patient location: PACU  Post pain: Pain level controlled  Post assessment: Post-op Vital signs reviewed, Patient's Cardiovascular Status Stable, Respiratory Function Stable, Patent Airway and No signs of Nausea or vomiting  Post vital signs: Reviewed and stable  Last Vitals:  Filed Vitals:   08/24/14 1148  BP:   Pulse: 78  Temp:   Resp: 13    Level of consciousness: awake, alert  and patient cooperative  Complications: No apparent anesthesia complications

## 2014-08-24 NOTE — Anesthesia Preprocedure Evaluation (Addendum)
Anesthesia Evaluation  Patient identified by MRN, date of birth, ID band Patient awake    Reviewed: Allergy & Precautions, NPO status , Patient's Chart, lab work & pertinent test results  Airway Mallampati: II       Dental no notable dental hx.    Pulmonary neg pulmonary ROS,    Pulmonary exam normal       Cardiovascular negative cardio ROS Normal cardiovascular exam    Neuro/Psych    GI/Hepatic negative GI ROS, Neg liver ROS,   Endo/Other  diabetes  Renal/GU negative Renal ROS     Musculoskeletal negative musculoskeletal ROS (+)   Abdominal (+) + obese,   Peds negative pediatric ROS (+)  Hematology negative hematology ROS (+)   Anesthesia Other Findings   Reproductive/Obstetrics                            Anesthesia Physical Anesthesia Plan  ASA: II  Anesthesia Plan: General   Post-op Pain Management:    Induction: Intravenous  Airway Management Planned: LMA and Oral ETT  Additional Equipment:   Intra-op Plan:   Post-operative Plan: Extubation in OR  Informed Consent: I have reviewed the patients History and Physical, chart, labs and discussed the procedure including the risks, benefits and alternatives for the proposed anesthesia with the patient or authorized representative who has indicated his/her understanding and acceptance.     Plan Discussed with: CRNA  Anesthesia Plan Comments:         Anesthesia Quick Evaluation

## 2015-05-08 ENCOUNTER — Encounter (HOSPITAL_COMMUNITY): Payer: Self-pay

## 2015-05-08 ENCOUNTER — Emergency Department (HOSPITAL_COMMUNITY): Payer: Medicaid Other

## 2015-05-08 DIAGNOSIS — Z8659 Personal history of other mental and behavioral disorders: Secondary | ICD-10-CM | POA: Insufficient documentation

## 2015-05-08 DIAGNOSIS — R0602 Shortness of breath: Secondary | ICD-10-CM | POA: Insufficient documentation

## 2015-05-08 DIAGNOSIS — R112 Nausea with vomiting, unspecified: Secondary | ICD-10-CM | POA: Insufficient documentation

## 2015-05-08 DIAGNOSIS — R079 Chest pain, unspecified: Secondary | ICD-10-CM | POA: Diagnosis not present

## 2015-05-08 LAB — CBC
HCT: 39.8 % (ref 36.0–46.0)
Hemoglobin: 13.3 g/dL (ref 12.0–15.0)
MCH: 28 pg (ref 26.0–34.0)
MCHC: 33.4 g/dL (ref 30.0–36.0)
MCV: 83.8 fL (ref 78.0–100.0)
PLATELETS: 307 10*3/uL (ref 150–400)
RBC: 4.75 MIL/uL (ref 3.87–5.11)
RDW: 12 % (ref 11.5–15.5)
WBC: 6.8 10*3/uL (ref 4.0–10.5)

## 2015-05-08 LAB — I-STAT TROPONIN, ED: TROPONIN I, POC: 0 ng/mL (ref 0.00–0.08)

## 2015-05-08 LAB — BASIC METABOLIC PANEL
Anion gap: 11 (ref 5–15)
BUN: 8 mg/dL (ref 6–20)
CALCIUM: 9.3 mg/dL (ref 8.9–10.3)
CO2: 23 mmol/L (ref 22–32)
CREATININE: 0.78 mg/dL (ref 0.44–1.00)
Chloride: 105 mmol/L (ref 101–111)
Glucose, Bld: 134 mg/dL — ABNORMAL HIGH (ref 65–99)
Potassium: 3.6 mmol/L (ref 3.5–5.1)
SODIUM: 139 mmol/L (ref 135–145)

## 2015-05-08 NOTE — ED Notes (Signed)
Patient here with 1 month of chest heaviness. Has been seen by cardiology and has had stress test for same. Reports that she is scheduled to have CT-A tomorrow. Today chest heaviness worse with nausea and vomiting x 2. Requesting further evaluation. Light headiness with same

## 2015-05-09 ENCOUNTER — Emergency Department (HOSPITAL_COMMUNITY)
Admission: EM | Admit: 2015-05-09 | Discharge: 2015-05-09 | Disposition: A | Discharge status: Home / Self Care | Payer: Medicaid Other | Attending: Emergency Medicine | Admitting: Emergency Medicine

## 2015-05-09 DIAGNOSIS — R079 Chest pain, unspecified: Secondary | ICD-10-CM

## 2015-05-09 NOTE — ED Provider Notes (Signed)
CSN: [id number]     Arrival date & time 05/08/15  1842 History   First MD Initiated Contact with Patient 05/09/15 0029     Chief Complaint  Patient presents with  . Chest Pain     (Consider location/radiation/quality/duration/timing/severity/associated sxs/prior Treatment) HPI Comments: Patient with history of depression presents with complaint of progressively worsening chest discomfort described as burning type pain, intermittent, with associated SOB. She states that one month ago when symptoms began her episodes of pain were infrequent and are now occuring throughout the day. No aggravating or alleviating factors. She was seen by her doctor 2 weeks ago and referred to cardiology who performed a treadmill stress test and echo. She reports she was told her study showed diastolic dysfunction and she also states she was referred for a CTA chest, scheduled tomorrow. She states the pain was more frequent today prompting ED evaluation.  Patient is a 40 y.o. female presenting with chest pain. The history is provided by the patient. No language interpreter was used.  Chest Pain Pain location:  L chest Pain quality: aching and burning   Pain radiates to:  Does not radiate Pain radiates to the back: no   Pain severity:  Moderate Duration:  1 month Timing:  Intermittent Progression:  Worsening Associated symptoms: nausea, shortness of breath and vomiting   Associated symptoms: no fever     Past Medical History  Diagnosis Date  . Depression   . Bladder leak    Past Surgical History  Procedure Laterality Date  . Appendectomy    . Cholecystectomy    . Intestinal malrotation repair    . Cesarean section    . Lads    . Dermoid cyst  excision    . Pubovaginal sling N/A 08/24/2014    Procedure: Leonides Grills;  Surgeon: Vena Austria, MD;  Location: [facility] ORS;  Service: Gynecology;  Laterality: N/A;   Family History  Problem Relation Age of Onset  . Hypertension Mother   . Breast  cancer Maternal Grandmother   . Obesity Mother   . Multiple sclerosis Father    Social History  Substance Use Topics  . Smoking status: Never Smoker   . Smokeless tobacco: Never Used  . Alcohol Use: No   OB History    Gravida Para Term Preterm AB TAB SAB Ectopic Multiple Living   Review of Systems  Constitutional: Negative for fever and chills.  HENT: Negative.   Respiratory: Positive for shortness of breath.   Cardiovascular: Positive for chest pain.  Gastrointestinal: Positive for nausea and vomiting.  Musculoskeletal: Negative.  Negative for myalgias.  Skin: Negative.   Neurological: Negative.       Allergies  Review of patient's allergies indicates no known allergies.  Home Medications   Prior to Admission medications   Not on File   BP 107/79 mmHg  Pulse 76  Temp(Src) 98.7 F (37.1 C) (Oral)  Resp 15  Ht  (1.575 m)  Wt 97.523 kg  BMI 39.31 kg/m2  SpO2 100%  LMP 04/23/2015 Physical Exam  Constitutional: She is oriented to person, place, and time. She appears well-developed and well-nourished.  HENT:  Head: Normocephalic.  Neck: Normal range of motion. Neck supple.  Cardiovascular: Normal rate and regular rhythm.   Pulmonary/Chest: Effort normal and breath sounds normal. She has no wheezes. She has no rales. She exhibits no tenderness.  Abdominal: Soft. Bowel sounds are normal. There  is no tenderness. There is no rebound and no guarding.  Musculoskeletal: Normal range of motion. She exhibits no edema.  Neurological: She is alert and oriented to person, place, and time.  Skin: Skin is warm and dry. No rash noted.  Psychiatric: She has a normal mood and affect.    ED Course  Procedures (including critical care time) Labs Review Labs Reviewed  BASIC METABOLIC PANEL - Abnormal; Notable for the following:    Glucose, Bld 134 (*)    All other components within normal limits  CBC  I-STAT TROPOININ, ED   Results for orders placed  or performed during the hospital encounter of 05/09/15  Basic metabolic panel  Result Value Ref Range   Sodium 139 135 - 145 mmol/L   Potassium 3.6 3.5 - 5.1 mmol/L   Chloride 105 101 - 111 mmol/L   CO2 23 22 - 32 mmol/L   Glucose, Bld 134 (H) 65 - 99 mg/dL   BUN 8 6 - 20 mg/dL   Creatinine, Ser 1.610.78 0.44 - 1.00 mg/dL   Calcium 9.3 8.9 - 09.610.3 mg/dL   GFR calc non Af Amer >60 >60 mL/min   GFR calc Af Amer >60 >60 mL/min   Anion gap 11 5 - 15  CBC  Result Value Ref Range   WBC 6.8 4.0 - 10.5 K/uL   RBC 4.75 3.87 - 5.11 MIL/uL   Hemoglobin 13.3 12.0 - 15.0 g/dL   HCT 04.539.8 40.936.0 - 81.146.0 %   MCV 83.8 78.0 - 100.0 fL   MCH 28.0 26.0 - 34.0 pg   MCHC 33.4 30.0 - 36.0 g/dL   RDW 91.412.0 78.211.5 - 95.615.5 %   Platelets 307 150 - 400 K/uL  I-stat troponin, ED  Result Value Ref Range   Troponin i, poc 0.00 0.00 - 0.08 ng/mL   Comment 3            Imaging Review Dg Chest 2 View  05/08/2015  CLINICAL DATA:  Chest pain and shortness of breath for several weeks. EXAM: CHEST  2 VIEW COMPARISON:  None. FINDINGS: The heart size and mediastinal contours are within normal limits. Both lungs are clear. The visualized skeletal structures are unremarkable. Prior cholecystectomy clips are identified. IMPRESSION: No active cardiopulmonary disease. Electronically Signed   By: Sherian ReinWei-Chen  Lin M.D.   On: 05/08/2015 19:34   I have personally reviewed and evaluated these images and lab results as part of my medical decision-making.   EKG Interpretation   Date/Time:  Tuesday May 08 2015 18:48:10 EDT Ventricular Rate:  90 PR Interval:  132 QRS Duration: 72 QT Interval:  352 QTC Calculation: 430 R Axis:   41 Text Interpretation:  Normal sinus rhythm Nonspecific ST abnormality  Abnormal ECG No previous ECGs available Confirmed by LITTLE MD, RACHEL  ([id number]) on 05/09/2015 1:01:58 AM      MDM   Final diagnoses:  None    1. Chest pain  The patient is a 40 year old without significant medical history with one  month of pain that is atypical for cardiac chest pain, with recent treadmill stress and echo, under the care of cardiology for evaluation of persistent pain. Normal lab studies tonight, non-acute EKG, normal CXR.  She is felt stable for discharge home tonight to continue her scheduled outpatient evaluation.     Elpidio AnisShari Romonda Parker, PA-C 05/09/15 0147  Laurence Spatesachel Morgan Little, MD 05/13/15 [phone]

## 2015-05-09 NOTE — Discharge Instructions (Signed)
Nonspecific Chest Pain  °Chest pain can be caused by many different conditions. There is always a chance that your pain could be related to something serious, such as a heart attack or a blood clot in your lungs. Chest pain can also be caused by conditions that are not life-threatening. If you have chest pain, it is very important to follow up with your health care provider. °CAUSES  °Chest pain can be caused by: °· Heartburn. °· Pneumonia or bronchitis. °· Anxiety or stress. °· Inflammation around your heart (pericarditis) or lung (pleuritis or pleurisy). °· A blood clot in your lung. °· A collapsed lung (pneumothorax). It can develop suddenly on its own (spontaneous pneumothorax) or from trauma to the chest. °· Shingles infection (varicella-zoster virus). °· Heart attack. °· Damage to the bones, muscles, and cartilage that make up your chest wall. This can include: °¨ Bruised bones due to injury. °¨ Strained muscles or cartilage due to frequent or repeated coughing or overwork. °¨ Fracture to one or more ribs. °¨ Sore cartilage due to inflammation (costochondritis). °RISK FACTORS  °Risk factors for chest pain may include: °· Activities that increase your risk for trauma or injury to your chest. °· Respiratory infections or conditions that cause frequent coughing. °· Medical conditions or overeating that can cause heartburn. °· Heart disease or family history of heart disease. °· Conditions or health behaviors that increase your risk of developing a blood clot. °· Having had chicken pox (varicella zoster). °SIGNS AND SYMPTOMS °Chest pain can feel like: °· Burning or tingling on the surface of your chest or deep in your chest. °· Crushing, pressure, aching, or squeezing pain. °· Dull or sharp pain that is worse when you move, cough, or take a deep breath. °· Pain that is also felt in your back, neck, shoulder, or arm, or pain that spreads to any of these areas. °Your chest pain may come and go, or it may stay  constant. °DIAGNOSIS °Lab tests or other studies may be needed to find the cause of your pain. Your health care provider may have you take a test called an ambulatory ECG (electrocardiogram). An ECG records your heartbeat patterns at the time the test is performed. You may also have other tests, such as: °· Transthoracic echocardiogram (TTE). During echocardiography, sound waves are used to create a picture of all of the heart structures and to look at how blood flows through your heart. °· Transesophageal echocardiogram (TEE). This is a more advanced imaging test that obtains images from inside your body. It allows your health care provider to see your heart in finer detail. °· Cardiac monitoring. This allows your health care provider to monitor your heart rate and rhythm in real time. °· Holter monitor. This is a portable device that records your heartbeat and can help to diagnose abnormal heartbeats. It allows your health care provider to track your heart activity for several days, if needed. °· Stress tests. These can be done through exercise or by taking medicine that makes your heart beat more quickly. °· Blood tests. °· Imaging tests. °TREATMENT  °Your treatment depends on what is causing your chest pain. Treatment may include: °· Medicines. These may include: °¨ Acid blockers for heartburn. °¨ Anti-inflammatory medicine. °¨ Pain medicine for inflammatory conditions. °¨ Antibiotic medicine, if an infection is present. °¨ Medicines to dissolve blood clots. °¨ Medicines to treat coronary artery disease. °· Supportive care for conditions that do not require medicines. This may include: °¨ Resting. °¨ Applying heat   or cold packs to injured areas. °¨ Limiting activities until pain decreases. °HOME CARE INSTRUCTIONS °· If you were prescribed an antibiotic medicine, finish it all even if you start to feel better. °· Avoid any activities that bring on chest pain. °· Do not use any tobacco products, including  cigarettes, chewing tobacco, or electronic cigarettes. If you need help quitting, ask your health care provider. °· Do not drink alcohol. °· Take medicines only as directed by your health care provider. °· Keep all follow-up visits as directed by your health care provider. This is important. This includes any further testing if your chest pain does not go away. °· If heartburn is the cause for your chest pain, you may be told to keep your head raised (elevated) while sleeping. This reduces the chance that acid will go from your stomach into your esophagus. °· Make lifestyle changes as directed by your health care provider. These may include: °¨ Getting regular exercise. Ask your health care provider to suggest some activities that are safe for you. °¨ Eating a heart-healthy diet. A registered dietitian can help you to learn healthy eating options. °¨ Maintaining a healthy weight. °¨ Managing diabetes, if necessary. °¨ Reducing stress. °SEEK MEDICAL CARE IF: °· Your chest pain does not go away after treatment. °· You have a rash with blisters on your chest. °· You have a fever. °SEEK IMMEDIATE MEDICAL CARE IF:  °· Your chest pain is worse. °· You have an increasing cough, or you cough up blood. °· You have severe abdominal pain. °· You have severe weakness. °· You faint. °· You have chills. °· You have sudden, unexplained chest discomfort. °· You have sudden, unexplained discomfort in your arms, back, neck, or jaw. °· You have shortness of breath at any time. °· You suddenly start to sweat, or your skin gets clammy. °· You feel nauseous or you vomit. °· You suddenly feel light-headed or dizzy. °· Your heart begins to beat quickly, or it feels like it is skipping beats. °These symptoms may represent a serious problem that is an emergency. Do not wait to see if the symptoms will go away. Get medical help right away. Call your local emergency services (911 in the [place] Do not drive yourself to the hospital. °  °This  information is not intended to replace advice given to you by your health care provider. Make sure you discuss any questions you have with your health care provider. °  °Document Released: 10/02/2004 Document Revised: 01/13/2014 Document Reviewed: 07/29/2013 °Elsevier Interactive Patient Education ©2016 [employer] ° °

## 2017-02-10 ENCOUNTER — Emergency Department: Payer: Self-pay

## 2017-02-10 ENCOUNTER — Other Ambulatory Visit: Payer: Self-pay

## 2017-02-10 ENCOUNTER — Emergency Department
Admission: EM | Admit: 2017-02-10 | Discharge: 2017-02-10 | Disposition: A | Discharge status: Home / Self Care | Payer: Self-pay | Attending: Emergency Medicine | Admitting: Emergency Medicine

## 2017-02-10 DIAGNOSIS — R079 Chest pain, unspecified: Secondary | ICD-10-CM | POA: Insufficient documentation

## 2017-02-10 LAB — COMPREHENSIVE METABOLIC PANEL
ALBUMIN: 4.1 g/dL (ref 3.5–5.0)
ALT: 13 U/L — ABNORMAL LOW (ref 14–54)
ANION GAP: 9 (ref 5–15)
AST: 23 U/L (ref 15–41)
Alkaline Phosphatase: 48 U/L (ref 38–126)
BUN: 10 mg/dL (ref 6–20)
CHLORIDE: 103 mmol/L (ref 101–111)
CO2: 25 mmol/L (ref 22–32)
Calcium: 9.3 mg/dL (ref 8.9–10.3)
Creatinine, Ser: 0.79 mg/dL (ref 0.44–1.00)
GFR calc Af Amer: >60 mL/min (ref >60)
GFR calc non Af Amer: >60 mL/min (ref >60)
GLUCOSE: 101 mg/dL — AB (ref 65–99)
POTASSIUM: 3.4 mmol/L — AB (ref 3.5–5.1)
Sodium: 137 mmol/L (ref 135–145)
Total Bilirubin: 0.7 mg/dL (ref 0.3–1.2)
Total Protein: 7.6 g/dL (ref 6.5–8.1)

## 2017-02-10 LAB — CBC
HCT: 39.3 % (ref 35.0–47.0)
Hemoglobin: 13.4 g/dL (ref 12.0–16.0)
MCH: 29 pg (ref 26.0–34.0)
MCHC: 34 g/dL (ref 32.0–36.0)
MCV: 85.2 fL (ref 80.0–100.0)
PLATELETS: 264 10*3/uL (ref 150–440)
RBC: 4.61 MIL/uL (ref 3.80–5.20)
RDW: 13 % (ref 11.5–14.5)
WBC: 7.4 10*3/uL (ref 3.6–11.0)

## 2017-02-10 LAB — TROPONIN I: Troponin I: <0.03 ng/mL (ref <0.03)

## 2017-02-10 MED ORDER — LORAZEPAM 1 MG PO TABS: 1.0000 mg | ORAL_TABLET | Freq: Three times a day (TID) | ORAL | 0 refills | Status: AC | PRN

## 2017-02-10 NOTE — ED Notes (Signed)
ED Provider at bedside. 

## 2017-02-10 NOTE — ED Provider Notes (Signed)
[facility] Emergency Department Provider Note       Time seen: ----------------------------------------- 2:17 PM on 02/10/2017 -----------------------------------------   I have reviewed the triage vital signs and the nursing notes.  HISTORY   Chief Complaint No chief complaint on file.    HPI [name] Velazquez is a 42 y.o. female with a history of depression who presents to the ED for chest pain.  Patient arrives via private vehicle complaining of chest pain is been going on for a week.  She describes tightness and pressure in the left side of her chest.  Patient states she woke up feeling short of breath and having her heart race.  She does not have any cardiac risk factors that she knows of.  She was told she had diastolic dysfunction by a doctor but does not know any other details.  She denies any recent illness, denies taking any blood pressure or cardiac medications.  Past Medical History:  Diagnosis Date  . Bladder leak   . Depression     Patient Active Problem List   Diagnosis Date Noted  . 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 APPENDECTOMY    . CESAREAN SECTION    . CHOLECYSTECTOMY    . DERMOID CYST  EXCISION    . INTESTINAL MALROTATION REPAIR    . lads    . PUBOVAGINAL SLING N/A 08/24/2014   Procedure: Leonides GrillsPUBO-VAGINAL SLING;  Surgeon: Vena AustriaAndreas Staebler, MD;  Location: [facility] ORS;  Service: Gynecology;  Laterality: N/A;    Allergies Patient has no known allergies.  Social History Social History   Tobacco Use  . Smoking status: Never Smoker  . Smokeless tobacco: Never Used  Substance Use Topics  . Alcohol use: No    Alcohol/week: 0.0 oz  . Drug use: No   Review of Systems Constitutional: Negative for fever. Cardiovascular: Positive for chest pain Respiratory: Negative for shortness of breath. Gastrointestinal: Negative for abdominal pain, vomiting and  diarrhea. Musculoskeletal: Negative for back pain. Skin: Negative for rash. Neurological: Negative for headaches, focal weakness or numbness.  All systems negative/normal/unremarkable except as stated in the HPI  ____________________________________________   PHYSICAL EXAM:  VITAL SIGNS: ED Triage Vitals  Enc Vitals Group     BP      Pulse      Resp      Temp      Temp src      SpO2      Weight      Height      Head Circumference      Peak Flow      Pain Score      Pain Loc      Pain Edu?      Excl. in GC?    Constitutional: Alert and oriented. Well appearing and in no distress. Eyes: Conjunctivae are normal. Normal extraocular movements. ENT   Head: Normocephalic and atraumatic.   Nose: No congestion/rhinnorhea.   Mouth/Throat: Mucous membranes are moist.   Neck: No stridor. Cardiovascular: Normal rate, regular rhythm. No murmurs, rubs, or gallops. Respiratory: Normal respiratory effort without tachypnea nor retractions. Breath sounds are clear and equal bilaterally. No wheezes/rales/rhonchi. Gastrointestinal: Soft and nontender. Normal bowel sounds Musculoskeletal: Nontender with normal range of motion in extremities. No lower extremity tenderness nor edema. Neurologic:  Normal speech and language. No gross focal neurologic deficits are appreciated.  Skin:  Skin is warm, dry and intact. No rash noted. Psychiatric: Mood and affect  are normal. Speech and behavior are normal.  ____________________________________________  EKG: Interpreted by me.  Sinus rhythm with a rate of 94 bpm, wide QRS, normal QT, normal axis.  ____________________________________________  ED COURSE:  As part of my medical decision making, I reviewed the following data within the electronic MEDICAL RECORD NUMBER History obtained from family if available, nursing notes, old chart and ekg, as well as notes from prior ED visits. Patient presented for chest pain of uncertain etiology, we  will assess with labs and imaging as indicated at this time.   Procedures ____________________________________________   LABS (pertinent positives/negatives)  Labs Reviewed  COMPREHENSIVE METABOLIC PANEL - Abnormal; Notable for the following components:      Result Value   Potassium 3.4 (*)    Glucose, Bld 101 (*)    ALT 13 (*)    All other components within normal limits  CBC  TROPONIN I    RADIOLOGY  Chest x-ray Is negative ____________________________________________  DIFFERENTIAL DIAGNOSIS   Unstable angina, PE, pneumothorax, dissection, muscular skeletal pain, GERD, anxiety  FINAL ASSESSMENT AND PLAN  Chest pain  Plan: Patient had presented for chest pain. Patient's labs were unremarkable. Patient's imaging was also negative.  Of advised her she needs outpatient follow-up.  I will prescribe Ativan to take as needed but this may be multifactorial.   Ulice Dash, MD   Note: This note was generated in part or whole with voice recognition software. Voice recognition is usually quite accurate but there are transcription errors that can and very often do occur. I apologize for any typographical errors that were not detected and corrected.     Emily Filbert, MD 02/10/17 [phone]

## 2017-02-10 NOTE — ED Triage Notes (Signed)
Pt comes via POV with c/o chest pain that has been going on all week. Pt states tightness and pressure in left side of chest. Pt states she has woken up feeling short of breath and heart racing.  Pt is alert and oriented and appears in NAD. Pt denies vomiting, but has had nausea

## 2017-02-10 NOTE — ED Notes (Signed)
Resumed care from samantha rn.  Pt alert.  nsr on monitor.  Pt reports heaviness in her chest   md aware.

## 2017-02-10 NOTE — ED Notes (Signed)
Patient transported to X-ray 

## 2017-03-11 ENCOUNTER — Emergency Department (HOSPITAL_COMMUNITY): Payer: Self-pay

## 2017-03-11 ENCOUNTER — Other Ambulatory Visit: Payer: Self-pay

## 2017-03-11 ENCOUNTER — Encounter (HOSPITAL_COMMUNITY): Payer: Self-pay | Admitting: *Deleted

## 2017-03-11 ENCOUNTER — Emergency Department (HOSPITAL_COMMUNITY)
Admission: EM | Admit: 2017-03-11 | Discharge: 2017-03-11 | Disposition: A | Discharge status: Home / Self Care | Payer: Self-pay | Attending: Emergency Medicine | Admitting: Emergency Medicine

## 2017-03-11 DIAGNOSIS — R079 Chest pain, unspecified: Secondary | ICD-10-CM | POA: Insufficient documentation

## 2017-03-11 DIAGNOSIS — Z5321 Procedure and treatment not carried out due to patient leaving prior to being seen by health care provider: Secondary | ICD-10-CM | POA: Insufficient documentation

## 2017-03-11 NOTE — ED Notes (Signed)
Pt left and handed in her labels and sts she is going to see her PCP

## 2017-03-11 NOTE — ED Triage Notes (Signed)
Pt arrives in triage anxious, states she is having chest pain, nausea and pain in left neck. Pt states last night her heart was racing and she was having pain, feels fatigued, will not stop talking long enough to complete assessment.

## 2017-05-30 IMAGING — DX DG CHEST 2V
2 series · 2 of 2 positions shown · non-contrast
Comparison: None.

CLINICAL DATA: Chest pain and shortness of breath for several
weeks.

EXAM:
CHEST  2 VIEW

[chest pa]
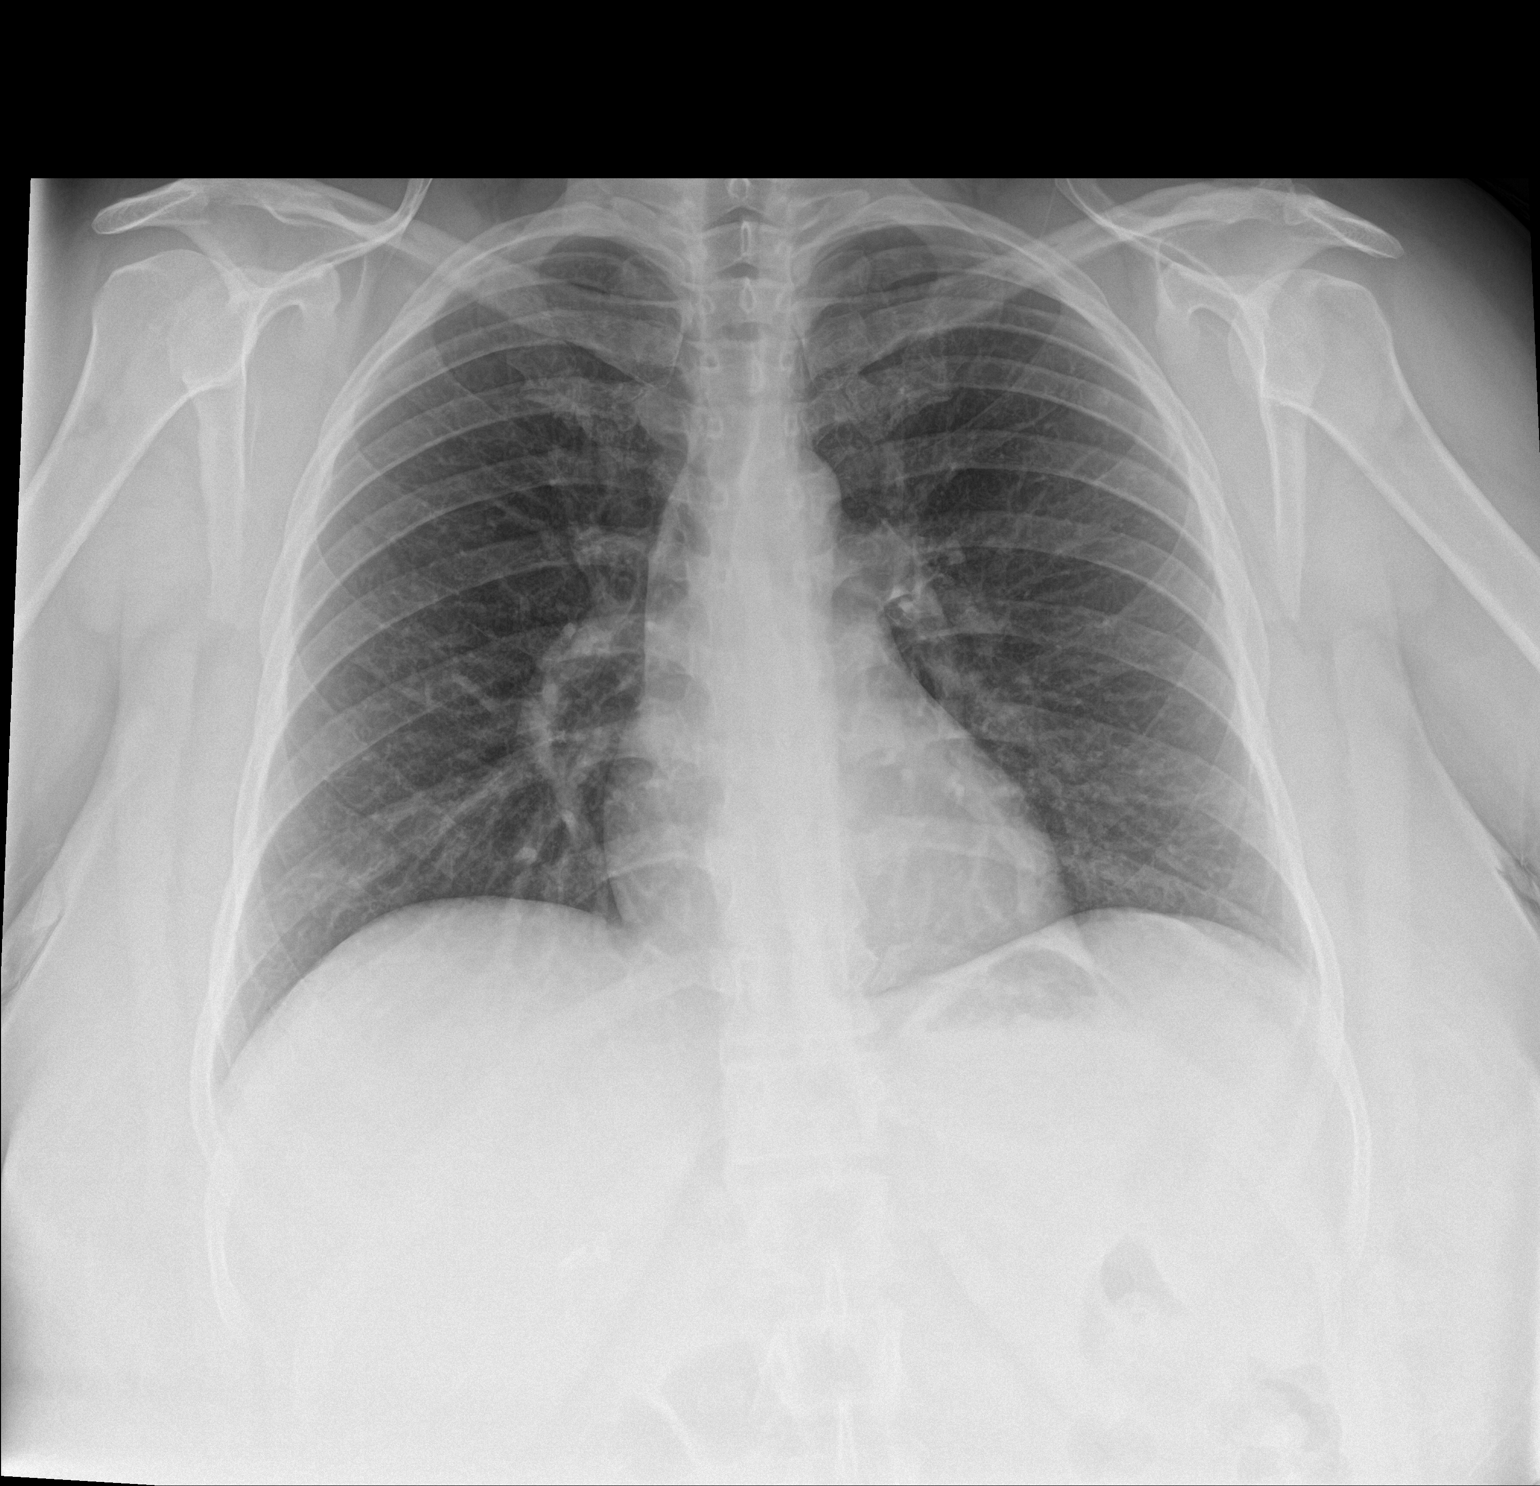

[chest lat]
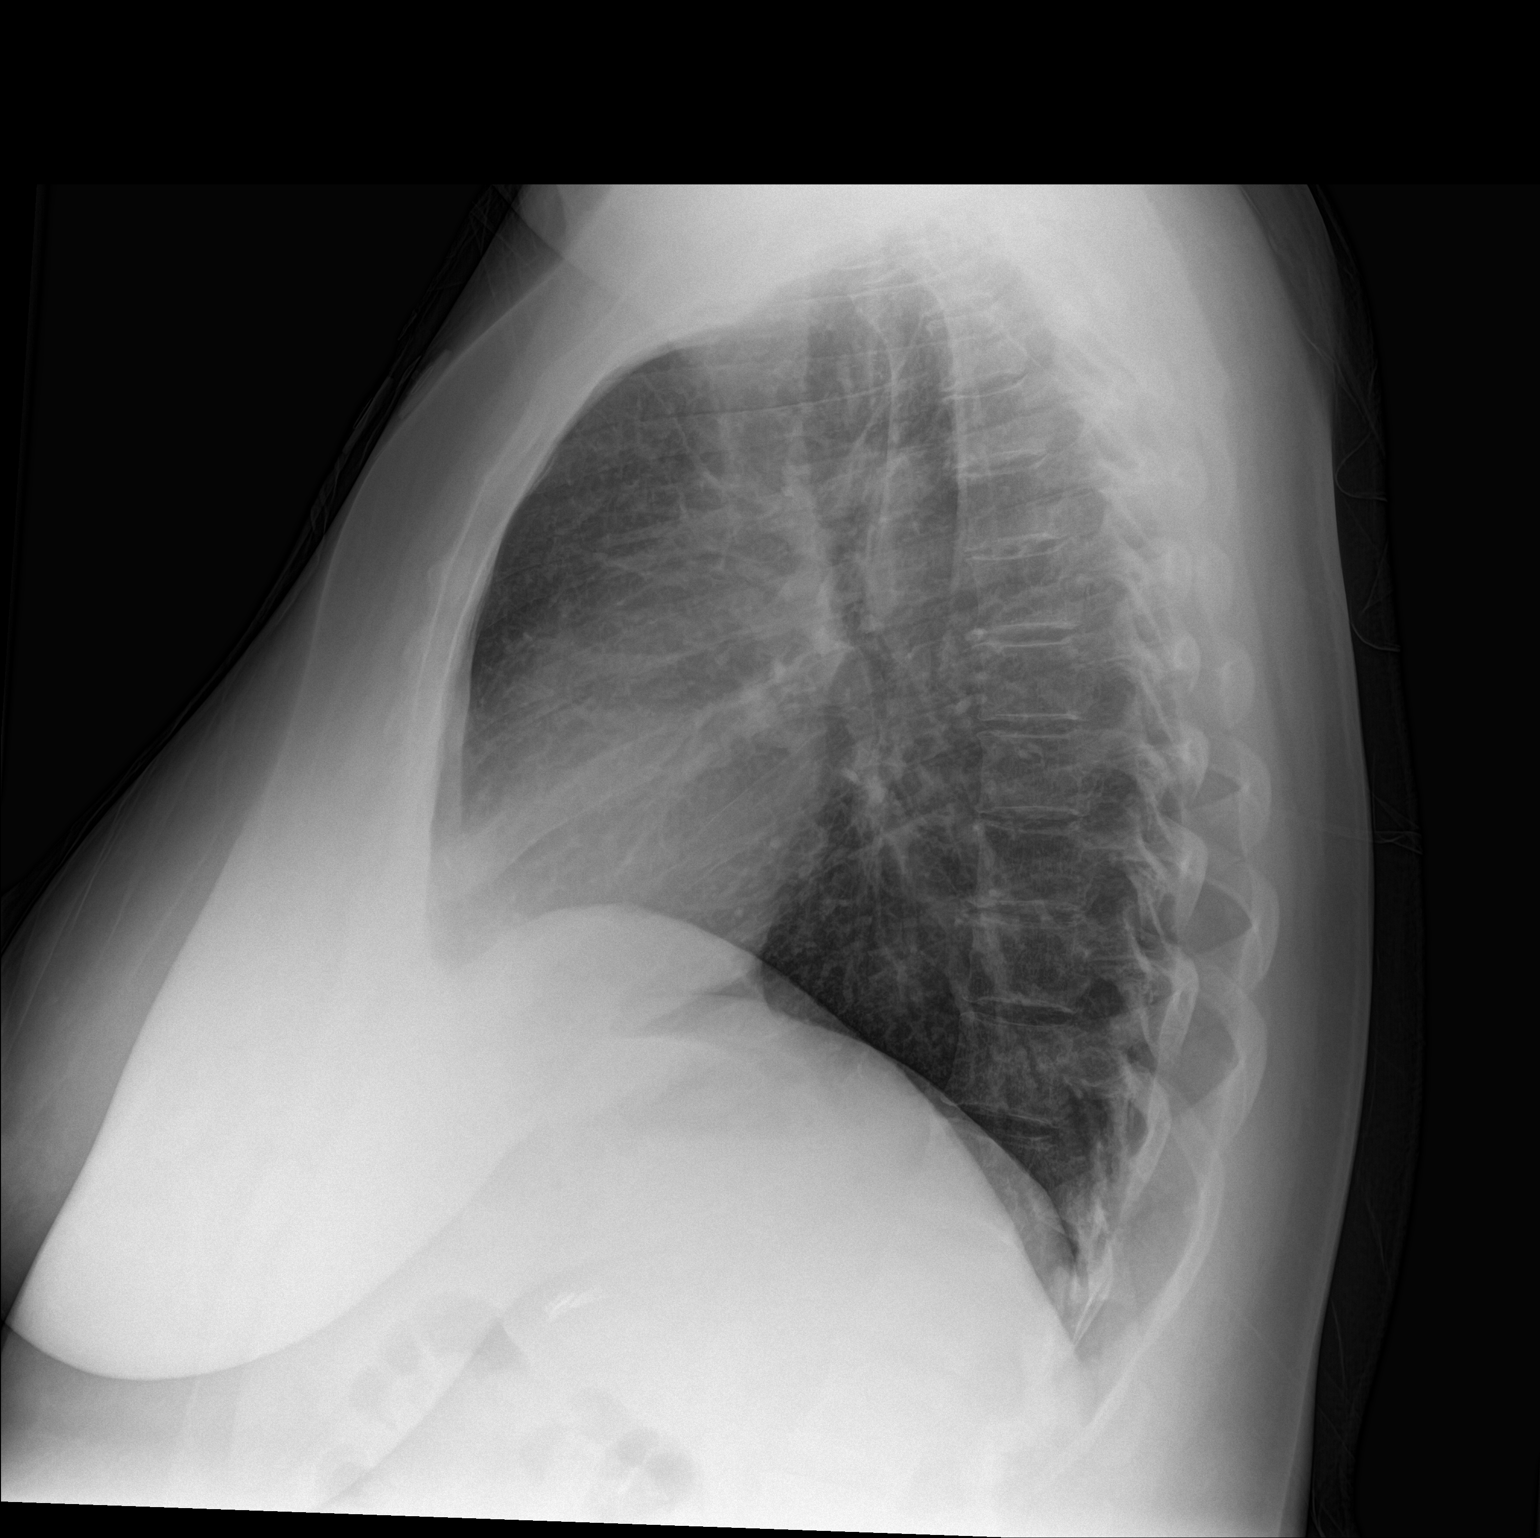

[2 of 2 positions shown; findings below may reference images not displayed]

FINDINGS: The heart size and mediastinal contours are within normal limits.
Both lungs are clear. The visualized skeletal structures are
unremarkable. Prior cholecystectomy clips are identified.
IMPRESSION: No active cardiopulmonary disease.

## 2018-01-04 ENCOUNTER — Emergency Department (HOSPITAL_COMMUNITY)
Admission: EM | Admit: 2018-01-04 | Discharge: 2018-01-04 | Disposition: A | Discharge status: Home / Self Care | Payer: Self-pay | Attending: Emergency Medicine | Admitting: Emergency Medicine

## 2018-01-04 ENCOUNTER — Encounter (HOSPITAL_COMMUNITY): Payer: Self-pay | Admitting: Emergency Medicine

## 2018-01-04 DIAGNOSIS — G8929 Other chronic pain: Secondary | ICD-10-CM

## 2018-01-04 DIAGNOSIS — M25511 Pain in right shoulder: Secondary | ICD-10-CM | POA: Insufficient documentation

## 2018-01-04 DIAGNOSIS — R202 Paresthesia of skin: Secondary | ICD-10-CM

## 2018-01-04 LAB — BASIC METABOLIC PANEL
ANION GAP: 9 (ref 5–15)
BUN: 14 mg/dL (ref 6–20)
CHLORIDE: 104 mmol/L (ref 98–111)
CO2: 25 mmol/L (ref 22–32)
Calcium: 9 mg/dL (ref 8.9–10.3)
Creatinine, Ser: 0.73 mg/dL (ref 0.44–1.00)
GFR calc Af Amer: >60 mL/min (ref >60)
GFR calc non Af Amer: >60 mL/min (ref >60)
Glucose, Bld: 98 mg/dL (ref 70–99)
POTASSIUM: 3.8 mmol/L (ref 3.5–5.1)
SODIUM: 138 mmol/L (ref 135–145)

## 2018-01-04 LAB — CBC
HCT: 41 % (ref 36.0–46.0)
HEMOGLOBIN: 13.3 g/dL (ref 12.0–15.0)
MCH: 28.7 pg (ref 26.0–34.0)
MCHC: 32.4 g/dL (ref 30.0–36.0)
MCV: 88.4 fL (ref 80.0–100.0)
NRBC: 0 % (ref 0.0–0.2)
Platelets: 299 10*3/uL (ref 150–400)
RBC: 4.64 MIL/uL (ref 3.87–5.11)
RDW: 11.9 % (ref 11.5–15.5)
WBC: 8.1 10*3/uL (ref 4.0–10.5)

## 2018-01-04 LAB — I-STAT BETA HCG BLOOD, ED (MC, WL, AP ONLY)

## 2018-01-04 LAB — CBG MONITORING, ED: GLUCOSE-CAPILLARY: 89 mg/dL (ref 70–99)

## 2018-01-04 NOTE — Discharge Instructions (Addendum)
Please call for follow up with a primary care physician

## 2018-01-04 NOTE — ED Triage Notes (Signed)
Patient here from home with complaints of left eye twitching, headache, left facial numbness and right arm numbness x2 weeks. Denies n/v.

## 2018-01-05 NOTE — ED Provider Notes (Signed)
[facility]-EMERGENCY DEPT Provider Note   CSN: [id number] Arrival date & time: 01/04/18  1548     History   Chief Complaint Chief Complaint  Patient presents with  . Numbness  . Headache  . Eye Twitching    HPI [name] Velazquez is a 42 y.o. female.  HPI Patient is a 42 year old female currently without a primary care physician who presents the emergency department with mild nonspecific headache over the past several weeks and some right shoulder pain worse with abduction past 90 degrees.  She does janitorial work in the evening time.  She states that she mops for 2-1/2-3 and half hours.  She has been doing this for several months.  She also reports some intermittent left facial numbness currently without any numbness.  She also reports some mild radiating numbness and paresthesias and pain from her left buttock down to her left knee.  She denies nausea vomiting.  There are several family members with multiple sclerosis and she is concerned about the possibility of MS and that this all may represent a "flare".  When she had insurance she was scheduled to have an MRI to further evaluate for MS but she has since lost her insurance and therefore does not have an MRI upcoming.  This is somewhat distressing to the patient. Past Medical History:  Diagnosis Date  . Bladder leak   . Depression     Patient Active Problem List   Diagnosis Date Noted  . 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 APPENDECTOMY    . CESAREAN SECTION    . CHOLECYSTECTOMY    . DERMOID CYST  EXCISION    . INTESTINAL MALROTATION REPAIR    . lads    . PUBOVAGINAL SLING N/A 08/24/2014   Procedure: Leonides GrillsPUBO-VAGINAL SLING;  Surgeon: Vena AustriaAndreas Staebler, MD;  Location: [facility] ORS;  Service: Gynecology;  Laterality: N/A;     OB History    Gravida  5   Para  4   Term  4   Preterm      AB      Living  4     SAB      TAB      Ectopic     Multiple      Live Births  4            Home Medications    Prior to Admission medications   Medication Sig Start Date End Date Taking? Authorizing Provider  LORazepam (ATIVAN) 1 MG tablet Take 1 tablet (1 mg total) by mouth every 8 (eight) hours as needed for anxiety. Patient not taking: Reported on 01/04/2018 02/10/17 02/10/18  Emily FilbertWilliams, Jonathan E, MD    Family History Family History  Problem Relation Age of Onset  . Hypertension Mother   . Obesity Mother   . Breast cancer Maternal Grandmother   . Multiple sclerosis Father     Social History Social History   Tobacco Use  . Smoking status: Never Smoker  . Smokeless tobacco: Never Used  Substance Use Topics  . Alcohol use: No    Alcohol/week: 0.0 standard drinks  . Drug use: No     Allergies   Patient has no known allergies.   Review of Systems Review of Systems  All other systems reviewed and are negative.    Physical Exam Updated Vital Signs BP (!) 135/94 (BP Location: Right Arm)   Pulse 81   Temp 98.9 F (37.2 C) (  Oral)   Resp 20   Ht 5\' 2"  (1.575 m)   Wt 81.6 kg   SpO2 100%   BMI 32.92 kg/m   Physical Exam Vitals signs and nursing note reviewed.  Constitutional:      General: She is not in acute distress.    Appearance: She is well-developed.  HENT:     Head: Normocephalic and atraumatic.  Eyes:     Pupils: Pupils are equal, round, and reactive to light.  Neck:     Musculoskeletal: Normal range of motion.  Cardiovascular:     Rate and Rhythm: Normal rate and regular rhythm.     Heart sounds: Normal heart sounds.  Pulmonary:     Effort: Pulmonary effort is normal.     Breath sounds: Normal breath sounds.  Abdominal:     General: There is no distension.     Palpations: Abdomen is soft.     Tenderness: There is no abdominal tenderness.  Musculoskeletal: Normal range of motion.     Comments: Normal passive range of motion of the right shoulder.  Normal grip strength equal  bilaterally.  No weakness of her arms or legs.  Full range of motion of major joints.  Normal radial pulses bilaterally.  Normal PT and DP pulses bilaterally.  Skin:    General: Skin is warm and dry.  Neurological:     Mental Status: She is alert and oriented to person, place, and time.     Comments: 5/5 strength in major muscle groups of  bilateral upper and lower extremities. Speech normal. No facial asymetry.   Psychiatric:        Judgment: Judgment normal.      ED Treatments / Results  Labs (all labs ordered are listed, but only abnormal results are displayed) Labs Reviewed  BASIC METABOLIC PANEL  CBC  CBG MONITORING, ED  I-STAT BETA HCG BLOOD, ED ([facility], [facility], [facility] ONLY)    EKG EKG Interpretation  Date/Time:  Monday January 04 2018 21:16:56 EST Ventricular Rate:  69 PR Interval:    QRS Duration: 95 QT Interval:  401 QTC Calculation: 430 R Axis:   40 Text Interpretation:  Sinus rhythm Low voltage, precordial leads No significant change was found Confirmed by Azalia Bilis ([id number]) on 01/04/2018 9:52:17 PM   Radiology No results found.  Procedures Procedures (including critical care time)  Medications Ordered in ED Medications - No data to display   Initial Impression / Assessment and Plan / ED Course  I have reviewed the triage vital signs and the nursing notes.  Pertinent labs & imaging results that were available during my care of the patient were reviewed by me and considered in my medical decision making (see chart for details).     Patient work-up here in the emergency department without significant abnormality.  I suspect her right shoulder may be a tendinitis or a partial rotator cuff tear.  When she establishes care with a primary care physician she will need to be referred to an orthopedic surgeon for further evaluation.  Left-sided hip pain sounds more like sciatica.  Nonspecific paresthesias and numbness.  Doubt stroke.  Doubt ACS.  No active chest pain at  this time.  Overall well-appearing.  She understands the importance of following up with a primary care physician and establishing care.  She is encouraged to return to the emergency department for any new or worsening symptoms.  Final Clinical Impressions(s) / ED Diagnoses   Final diagnoses:  Chronic right shoulder pain  Paresthesia    ED Discharge Orders    None       Azalia Bilisampos, Keela Rubert, MD 01/05/18 [phone]

## 2018-07-15 ENCOUNTER — Other Ambulatory Visit: Payer: Self-pay | Admitting: Nurse Practitioner

## 2018-07-15 DIAGNOSIS — Z1231 Encounter for screening mammogram for malignant neoplasm of breast: Secondary | ICD-10-CM

## 2018-07-19 ENCOUNTER — Other Ambulatory Visit: Payer: Self-pay | Admitting: Nurse Practitioner

## 2018-08-20 DIAGNOSIS — M791 Myalgia, unspecified site: Secondary | ICD-10-CM | POA: Insufficient documentation

## 2018-08-24 ENCOUNTER — Ambulatory Visit
Admission: RE | Admit: 2018-08-24 | Discharge: 2018-08-24 | Disposition: A | Discharge status: Home / Self Care | Payer: Medicaid Other | Source: Ambulatory Visit | Attending: Nurse Practitioner | Admitting: Nurse Practitioner

## 2018-08-24 DIAGNOSIS — Z1231 Encounter for screening mammogram for malignant neoplasm of breast: Secondary | ICD-10-CM | POA: Insufficient documentation

## 2018-09-09 DIAGNOSIS — R768 Other specified abnormal immunological findings in serum: Secondary | ICD-10-CM | POA: Insufficient documentation

## 2018-09-09 DIAGNOSIS — E538 Deficiency of other specified B group vitamins: Secondary | ICD-10-CM | POA: Insufficient documentation

## 2018-09-16 ENCOUNTER — Encounter: Payer: Self-pay | Admitting: *Deleted

## 2018-09-20 ENCOUNTER — Ambulatory Visit: Payer: Medicaid Other | Admitting: Neurology

## 2018-09-20 ENCOUNTER — Other Ambulatory Visit: Payer: Self-pay

## 2018-09-20 ENCOUNTER — Encounter: Payer: Self-pay | Admitting: Neurology

## 2018-09-20 ENCOUNTER — Telehealth: Payer: Self-pay | Admitting: Neurology

## 2018-09-20 VITALS — BP 124/81 | HR 62 | Temp 98.4°F | Ht 62.0 in | Wt 191.0 lb

## 2018-09-20 DIAGNOSIS — H5711 Ocular pain, right eye: Secondary | ICD-10-CM | POA: Diagnosis not present

## 2018-09-20 DIAGNOSIS — R202 Paresthesia of skin: Secondary | ICD-10-CM | POA: Insufficient documentation

## 2018-09-20 MED ORDER — DULOXETINE HCL 60 MG PO CPEP: 60.0000 mg | ORAL_CAPSULE | Freq: Every day | ORAL | 12 refills | Status: DC

## 2018-09-20 NOTE — Telephone Encounter (Signed)
medicaid order sent to [facility]. They will obtain the auth and reach out to the patient to schedule.  °

## 2018-09-20 NOTE — Progress Notes (Signed)
PATIENT: [name] Velazquez DOB: [birth year]  Chief Complaint  Patient presents with   r/o MS    She is concerned about the following symptoms:  intermittent numbness/tingling in legs and arms. tightness in thighs, brain fog.  Reports her father and sister have been diagnosed wtih MS.  Her father passed away at age 42.    PCP    Fayrene HelperBoswell, Chelsa H, NP     HISTORICAL  [name] Velazquez is a 43 year old female, seen in request by her primary care nurse practitioner Orson EvaBoswell, Chelsa for evaluation of numbness in legs, arms, brain foggy sensation, initial evaluation was on September 20, 2018.  I have reviewed and summarized the referring note from the referring physician.  She complains of bilateral lower extremity paresthesia since 2017, she described tightness sensation, numbness tingling, as if she has a tight band at the midline, she also has frequent bilateral lower extremity muscle spasm, tightness," it is horrible, sometimes I want to cut it off".  Over the years, her symptoms were intermittent, but overall getting worse, become more frequent, more severe, since July 2020, she also have similar involvement of bilateral hands, she has bilateral hands numbness tingling, spreading to her arms, feel clumsiness, she denies significant gait abnormality, no incontinence, she does has a history of stress incontinence, improved by bladder sling,  She also complains of occasionally right eye painful twitching, but denies visual loss  Her father was diagnosed with multiple sclerosis, died at age 42, had significant gait abnormality, wheelchair-bound, her sister at age 951, also was diagnosed with multiple sclerosis, she is concerned, her above symptoms might represent some underlying central nervous system pathology  She does complains of significant fatigue, depression, anxiety,  Lab in August 2020, negative anticardiolipin antibody, CPK of 60, vitamin B12 279, normal CBC hemoglobin of 12.2,  CMP,   REVIEW OF SYSTEMS: Full 14 system review of systems performed and notable only for as above All other review of systems were negative.  ALLERGIES: No Known Allergies  HOME MEDICATIONS: Current Outpatient Medications  Medication Sig Dispense Refill   clonazePAM (KLONOPIN) 1 MG tablet Take 1 mg by mouth as needed for anxiety.     vitamin B-12 (CYANOCOBALAMIN) 1000 MCG tablet Take 1,000 mcg by mouth.     VITAMIN D PO Take 1 tablet by mouth daily.     No current facility-administered medications for this visit.     PAST MEDICAL HISTORY: Past Medical History:  Diagnosis Date   Anxiety    Bladder leak    Chronic diastolic heart failure (HCC)    Depression    Depression    Hyperlipemia    Lateral epicondylitis    Lumbago    Mitral regurgitation    Numbness and tingling    Numbness and tingling    Phlebitis or thrombophlebitis of deep vessel of lower extremity (HCC)    Stress incontinence in female    Vitamin B12 deficiency    Vitamin D deficiency     PAST SURGICAL HISTORY: Past Surgical History:  Procedure Laterality Date   APPENDECTOMY     CESAREAN SECTION     CHOLECYSTECTOMY     DERMOID CYST  EXCISION     INTESTINAL MALROTATION REPAIR     lads     PUBOVAGINAL SLING N/A 08/24/2014   Procedure: Leonides GrillsPUBO-VAGINAL SLING;  Surgeon: Vena AustriaAndreas Staebler, MD;  Location: [facility] ORS;  Service: Gynecology;  Laterality: N/A;    FAMILY HISTORY: Family History  Problem Relation Age of Onset  Hypertension Mother        died at age 26   Obesity Mother    Heart disease Mother    Breast cancer Maternal Grandmother 84   Multiple sclerosis Father        died at age 74   Multiple sclerosis Sister     SOCIAL HISTORY: Social History   Socioeconomic History   Marital status: Single    Spouse name: Not on file   Number of children: 5   Years of education: 12   Highest education level: Not on file  Occupational History   Occupation: Microbiologist  buildings  Scientist, product/process development strain: Not on file   Food insecurity    Worry: Not on file    Inability: Not on file   Transportation needs    Medical: Not on file    Non-medical: Not on file  Tobacco Use   Smoking status: Never Smoker   Smokeless tobacco: Never Used  Substance and Sexual Activity   Alcohol use: No    Alcohol/week: 0.0 standard drinks   Drug use: No   Sexual activity: Yes  Lifestyle   Physical activity    Days per week: Not on file    Minutes per session: Not on file   Stress: Not on file  Relationships   Social connections    Talks on phone: Not on file    Gets together: Not on file    Attends religious service: Not on file    Active member of club or organization: Not on file    Attends meetings of clubs or organizations: Not on file    Relationship status: Not on file   Intimate partner violence    Fear of current or ex partner: Not on file    Emotionally abused: Not on file    Physically abused: Not on file    Forced sexual activity: Not on file  Other Topics Concern   Not on file  Social History Narrative   Lives at home with her children.   Right-handed.   2 cups coffee, 1 can of soda per day.     PHYSICAL EXAM   Vitals:   09/20/18 1336  BP: 124/81  Pulse: 62  Temp: 98.4 F (36.9 C)  Weight: 191 lb (86.6 kg)  Height: 5\' 2"  (1.575 m)    Not recorded      Body mass index is 34.93 kg/m.  PHYSICAL EXAMNIATION:  Gen: NAD, conversant, well nourised, well groomed                     Cardiovascular: Regular rate rhythm, no peripheral edema, warm, nontender. Eyes: Conjunctivae clear without exudates or hemorrhage Neck: Supple, no carotid bruits. Pulmonary: Clear to auscultation bilaterally   NEUROLOGICAL EXAM:  MENTAL STATUS: Speech:    Speech is normal; fluent and spontaneous with normal comprehension.  Cognition:     Orientation to time, place and person     Normal recent and remote memory      Normal Attention span and concentration     Normal Language, naming, repeating,spontaneous speech     Fund of knowledge   CRANIAL NERVES: CN II: Visual fields are full to confrontation.  Pupils are round equal and briskly reactive to light. CN III, IV, VI: extraocular movement are normal. No ptosis. CN V: Facial sensation is intact to pinprick in all 3 divisions bilaterally. Corneal responses are intact.  CN VII: Face is symmetric with normal  eye closure and smile. CN VIII: Hearing is normal to causal conversation. CN IX, X: Palate elevates symmetrically. Phonation is normal. CN XI: Head turning and shoulder shrug are intact CN XII: Tongue is midline with normal movements and no atrophy.  MOTOR: There is no pronator drift of out-stretched arms. Muscle bulk and tone are normal. Muscle strength is normal.  REFLEXES: Reflexes are 2+ and symmetric at the biceps, triceps, knees, and ankles. Plantar responses are flexor.  SENSORY: Intact to light touch, pinprick, positional sensation and vibratory sensation are intact in fingers and toes.  COORDINATION: Rapid alternating movements and fine finger movements are intact. There is no dysmetria on finger-to-nose and heel-knee-shin.    GAIT/STANCE: Posture is normal. Gait is steady with normal steps, base, arm swing, and turning. Heel and toe walking are normal. Tandem gait is normal.  Romberg is absent.   DIAGNOSTIC DATA (LABS, IMAGING, TESTING) - I reviewed patient records, labs, notes, testing and imaging myself where available.   ASSESSMENT AND PLAN  [name] Velazquez is a 43 y.o. female   Bilateral upper and lower extremity paresthesia  Need to rule out MS, proceed with MRI of cervical, and MRI of the brain  Add on Cymbalta 60 mg daily  Levert Feinstein, M.D. Ph.D.  [facility] [address], [address] [place], [place] [address] Ph: [phone] Fax: [phone]  CC: Fayrene Helper, NP

## 2018-09-22 ENCOUNTER — Encounter: Payer: Self-pay | Admitting: Neurology

## 2018-10-16 ENCOUNTER — Ambulatory Visit
Admission: RE | Admit: 2018-10-16 | Discharge: 2018-10-16 | Disposition: A | Discharge status: Home / Self Care | Payer: Medicaid Other | Source: Ambulatory Visit | Attending: Neurology | Admitting: Neurology

## 2018-10-16 ENCOUNTER — Other Ambulatory Visit: Payer: Self-pay

## 2018-10-16 DIAGNOSIS — H5711 Ocular pain, right eye: Secondary | ICD-10-CM

## 2018-10-16 DIAGNOSIS — R202 Paresthesia of skin: Secondary | ICD-10-CM | POA: Diagnosis not present

## 2019-03-05 IMAGING — CR DG CHEST 2V
2 series · 2 of 2 positions shown · non-contrast
Comparison: 05/08/2015

CLINICAL DATA: Pt comes via POV with c/o chest pain that has been
going on all week. Pt states tightness and pressure in left side of
chest. Pt states she has woken up feeling short of breath and heart
racing. Also has some left leg numbness. Hx of bronchitis. Non
smoker

EXAM:
CHEST  2 VIEW

[chest pa]
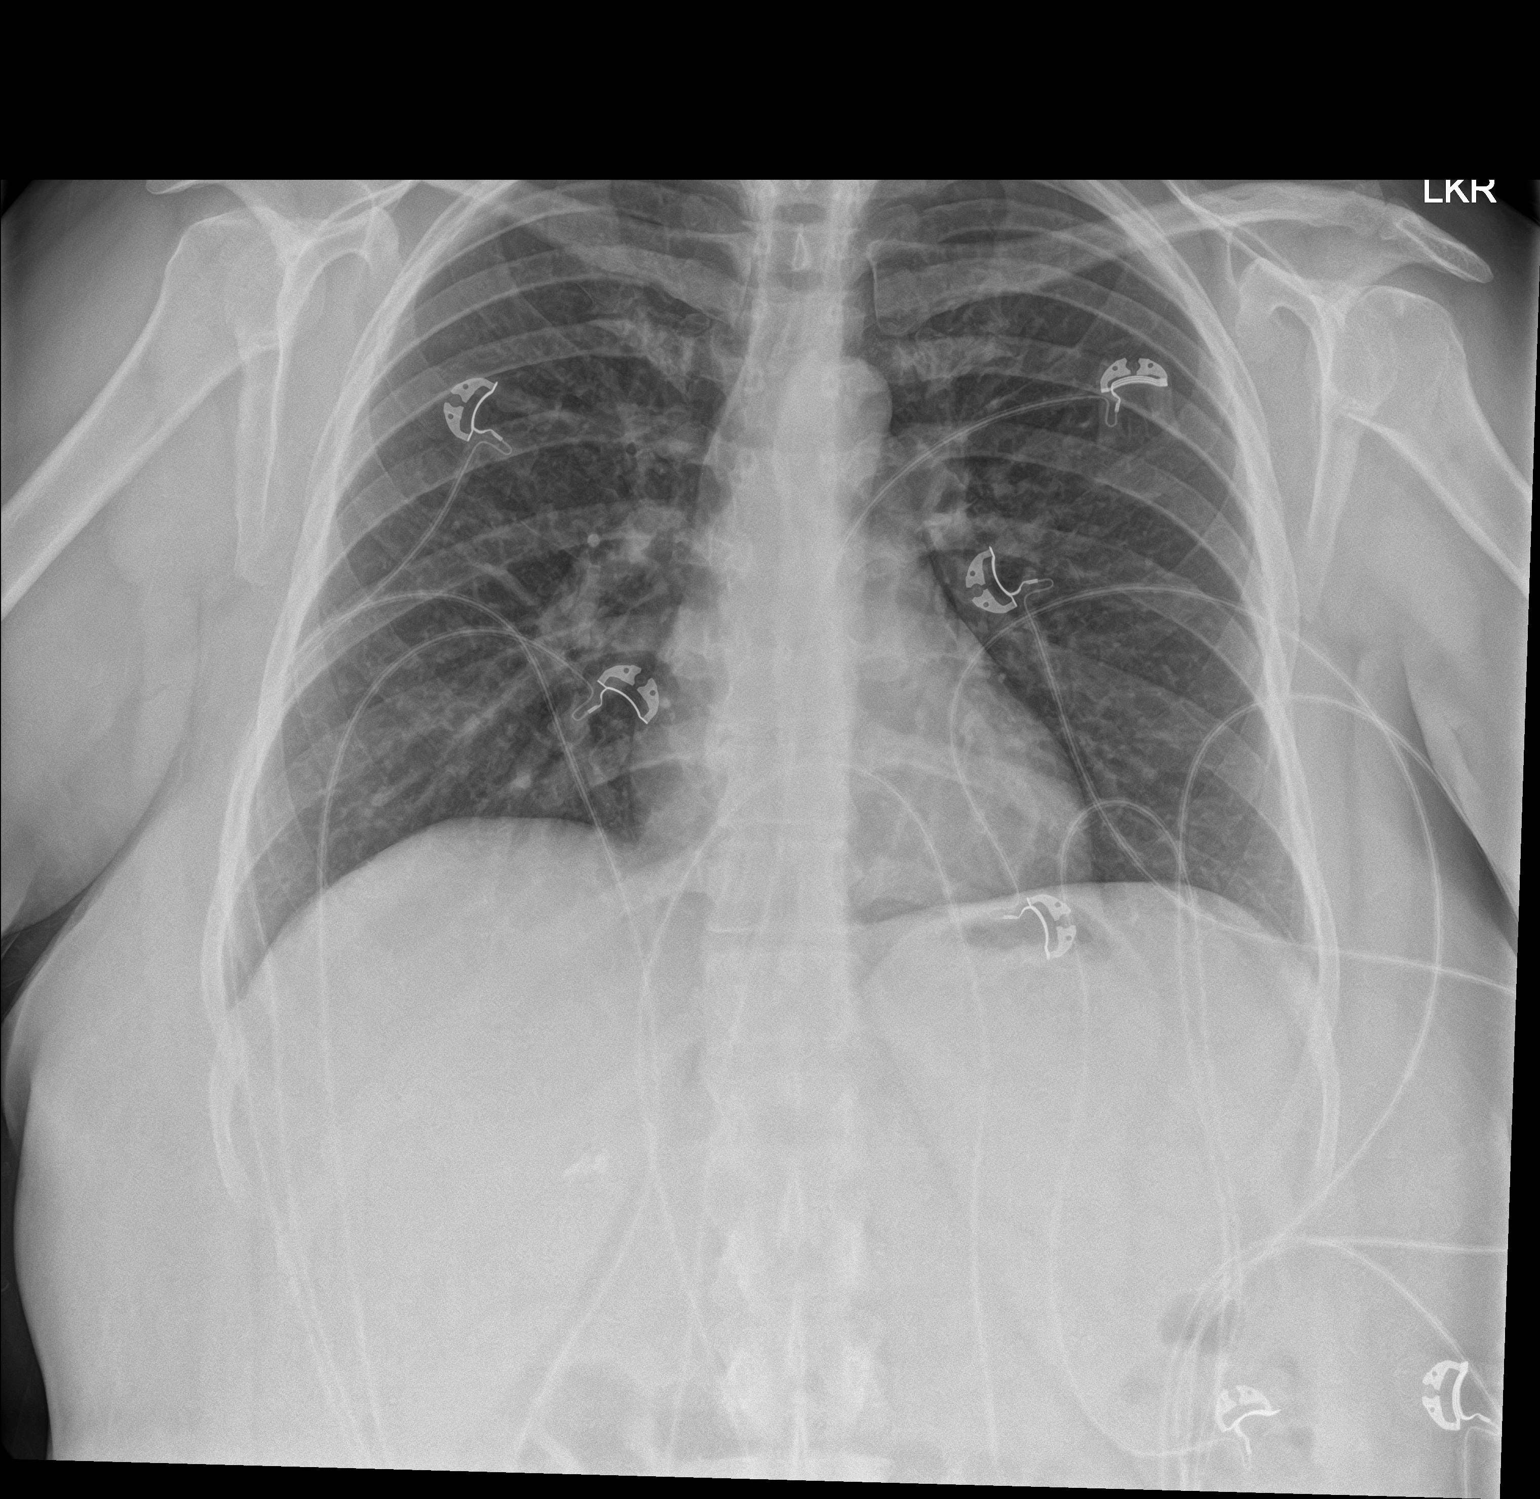

[chest lat]
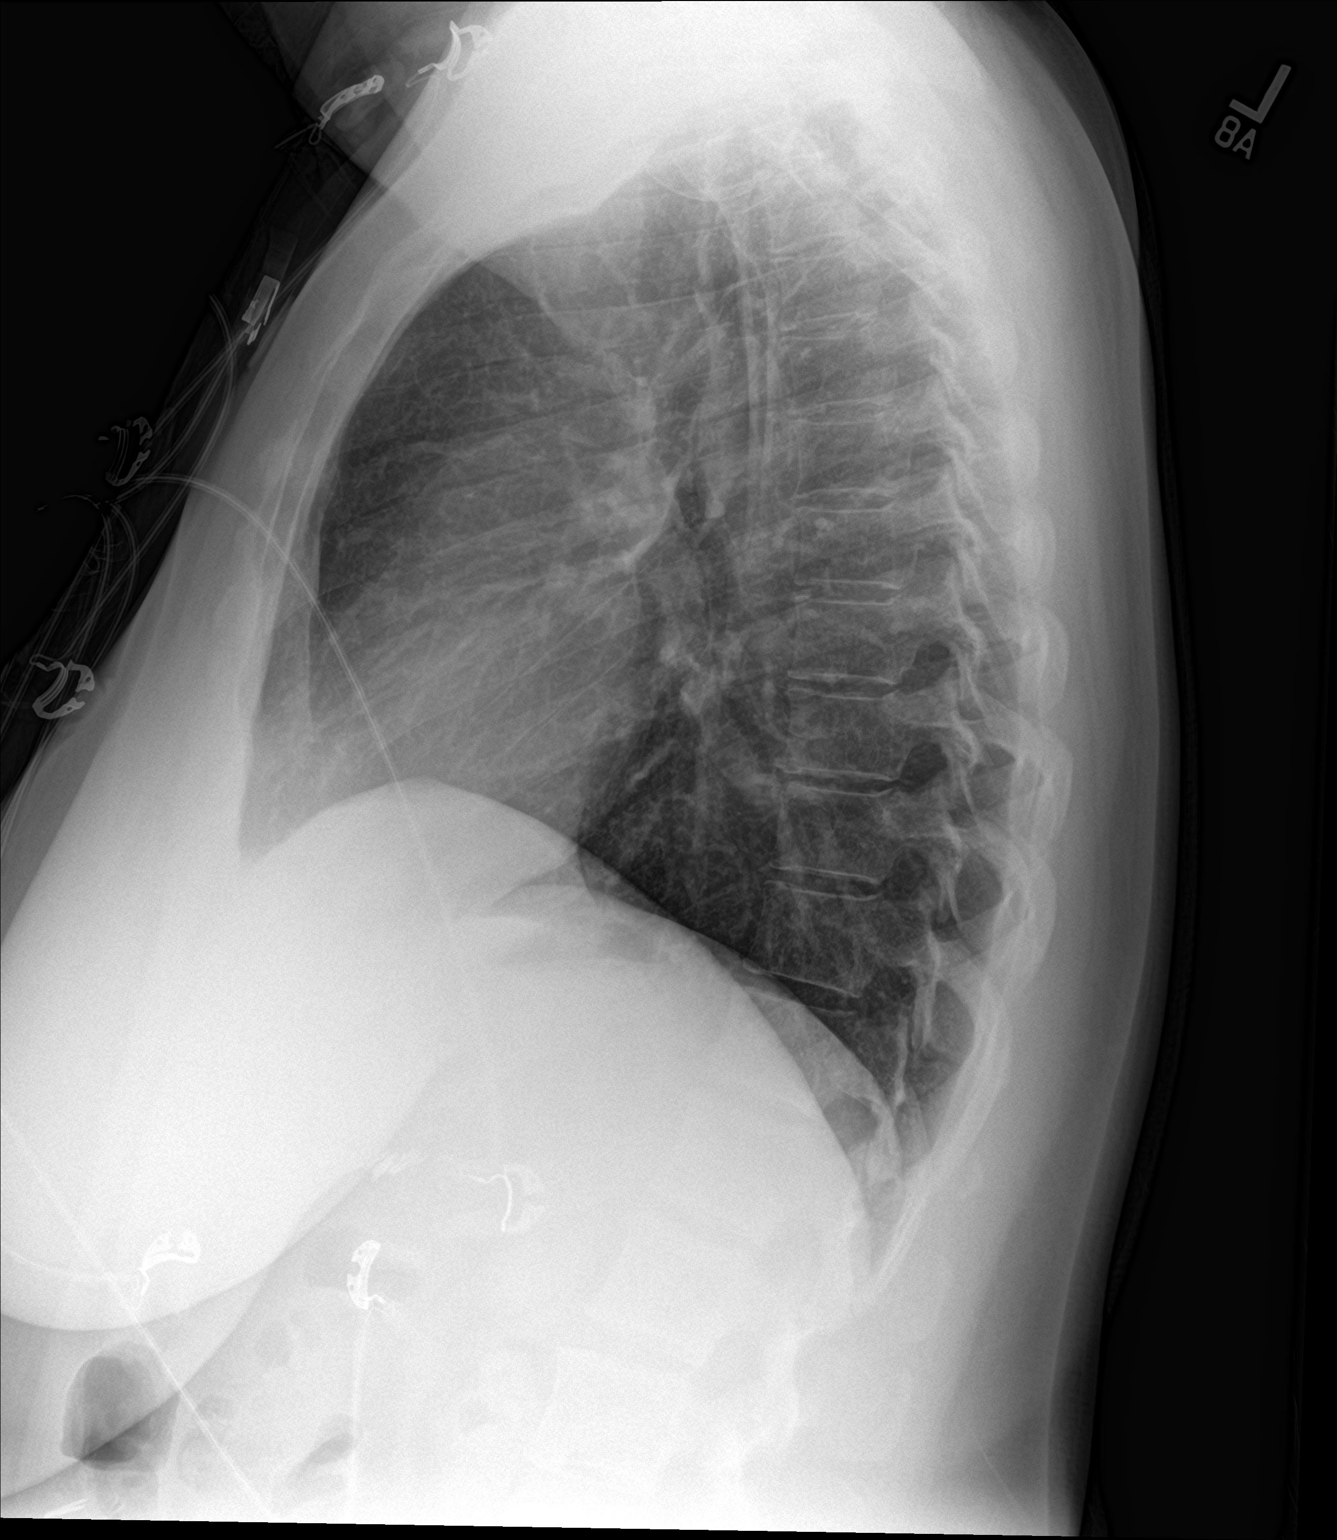

[2 of 2 positions shown; findings below may reference images not displayed]

FINDINGS: The heart size and mediastinal contours are within normal limits.
Both lungs are clear. No pleural effusion or pneumothorax. The
visualized skeletal structures are unremarkable.
IMPRESSION: No active cardiopulmonary disease.

## 2019-04-03 IMAGING — CR DG CHEST 2V
2 series · 2 of 2 positions shown · non-contrast
Comparison: PA and lateral chest 02/10/2017 and 05/08/2015.

CLINICAL DATA: Intermittent chest pain and shortness of breath for
1 month. Dizziness beginning today.

EXAM:
CHEST - 2 VIEW

[w chest pa]
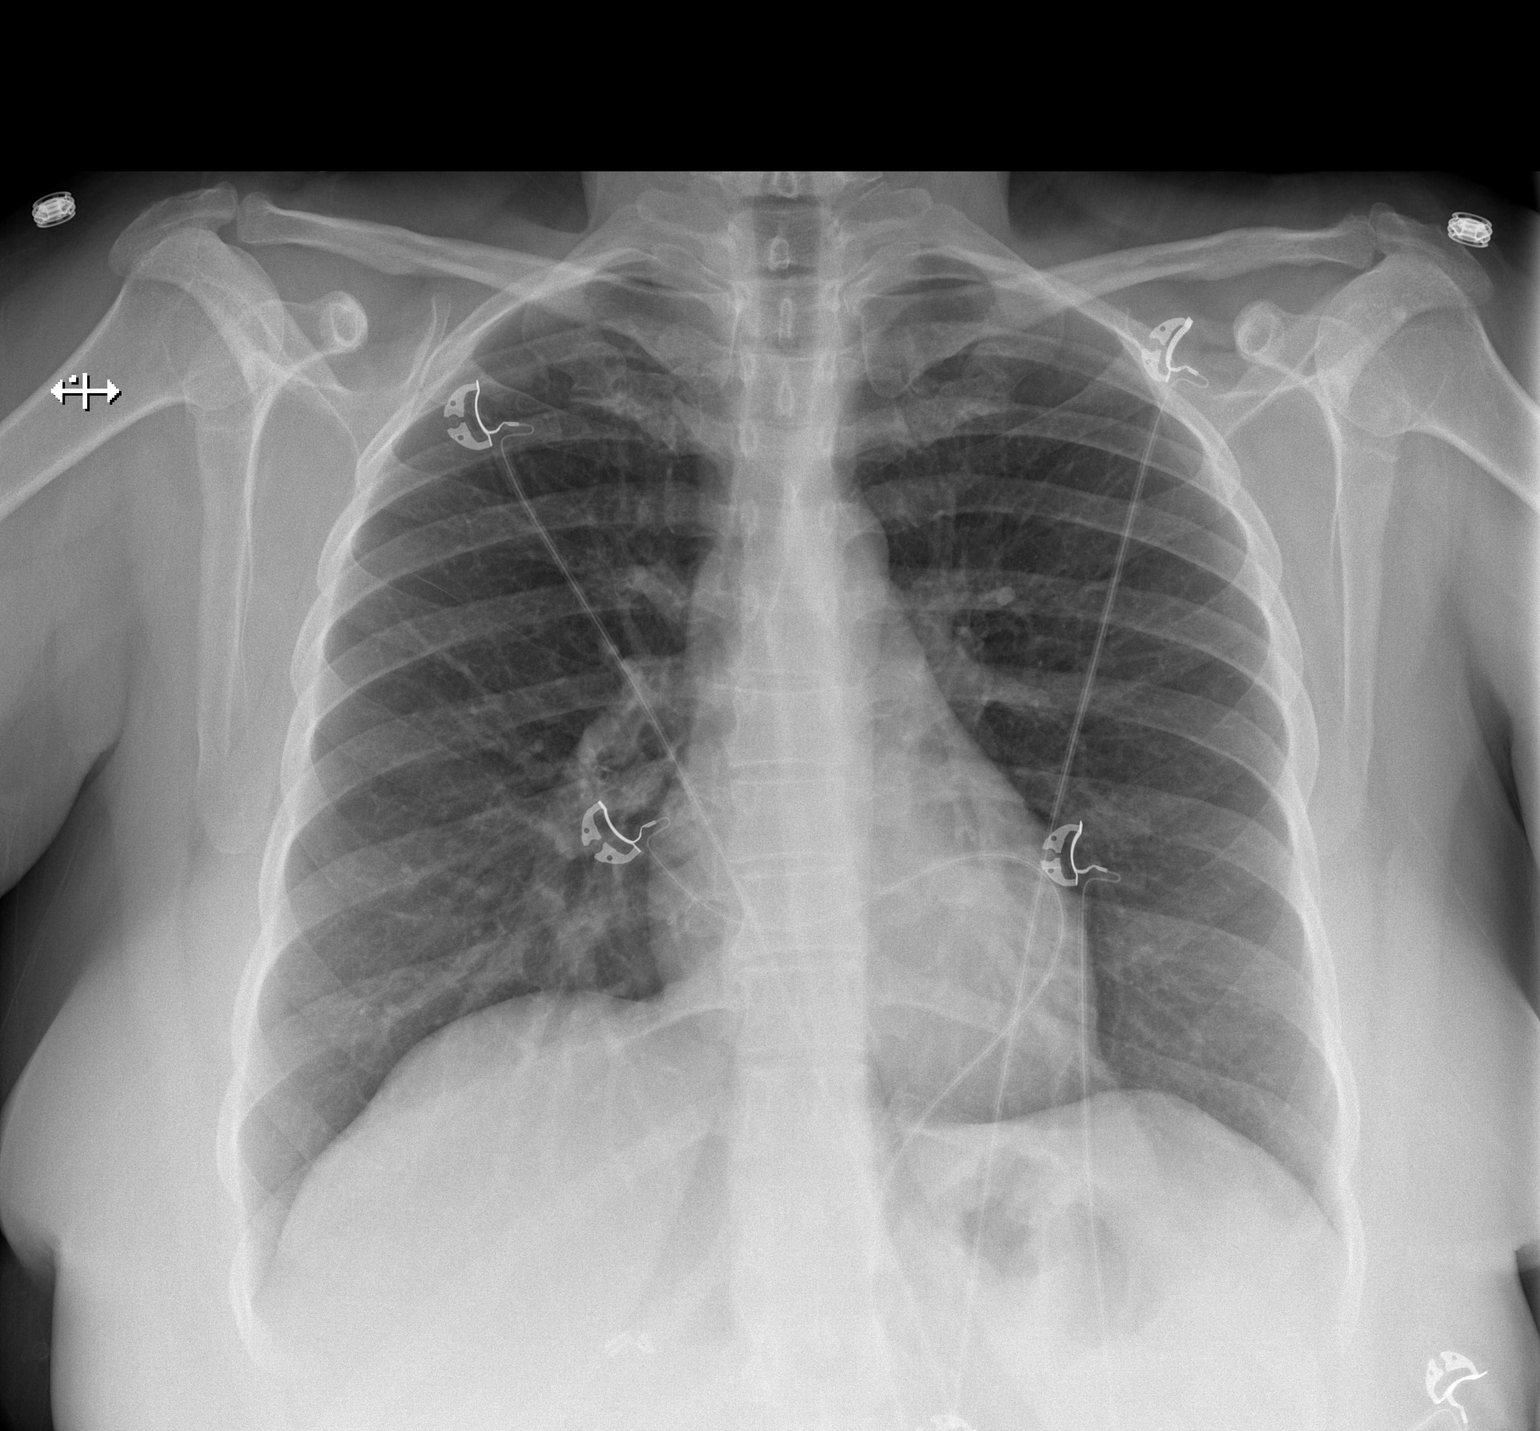

[w chest lat]
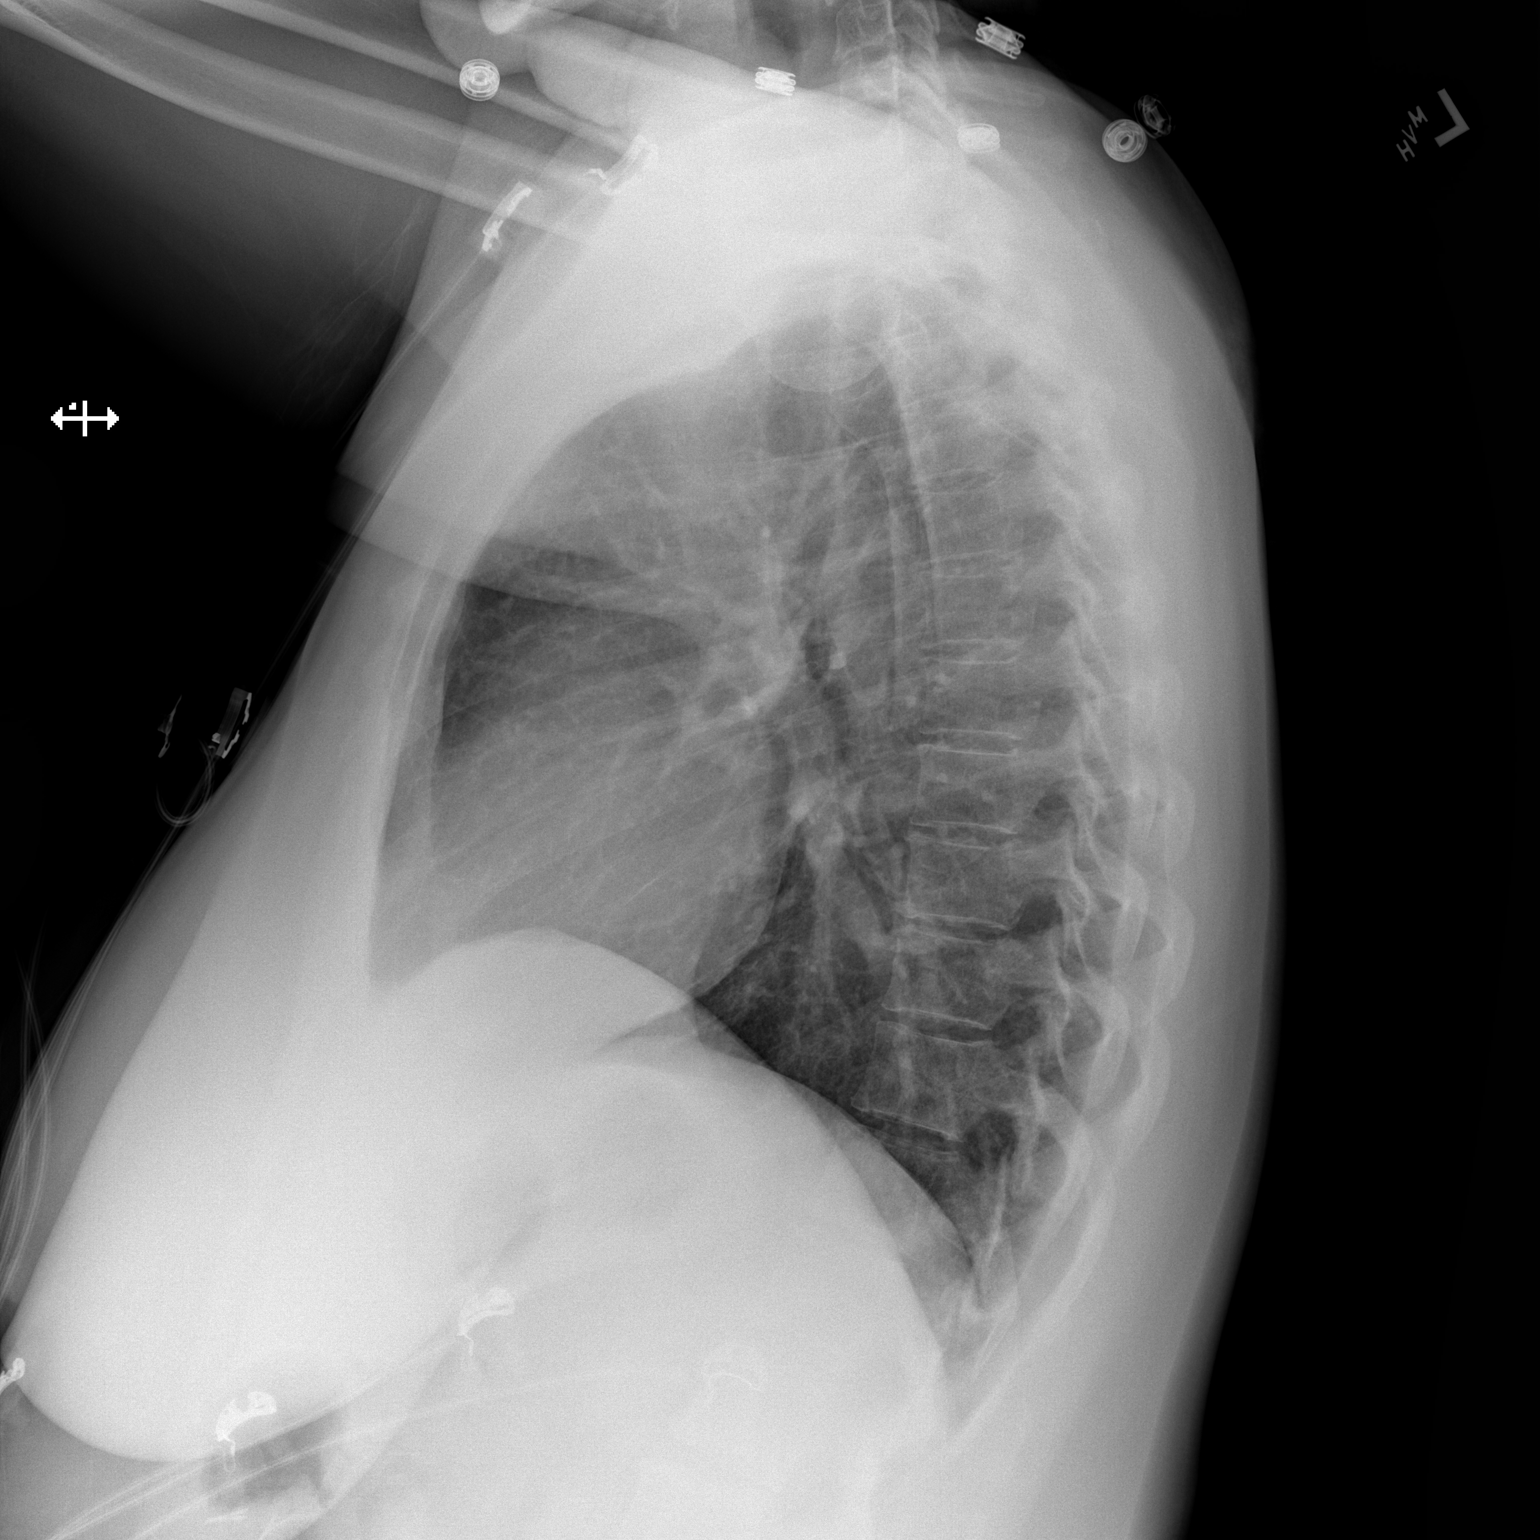

[2 of 2 positions shown; findings below may reference images not displayed]

FINDINGS: The lungs are clear. Heart size is normal. There is no pneumothorax
or pleural fluid. No bony abnormality.
IMPRESSION: Normal chest.

## 2019-12-03 ENCOUNTER — Ambulatory Visit
Admission: EM | Admit: 2019-12-03 | Discharge: 2019-12-03 | Disposition: A | Discharge status: Home / Self Care | Payer: Medicaid Other | Attending: Family Medicine | Admitting: Family Medicine

## 2019-12-03 ENCOUNTER — Other Ambulatory Visit: Payer: Self-pay

## 2019-12-03 ENCOUNTER — Encounter: Payer: Self-pay | Admitting: Emergency Medicine

## 2019-12-03 DIAGNOSIS — N3000 Acute cystitis without hematuria: Secondary | ICD-10-CM | POA: Diagnosis not present

## 2019-12-03 LAB — POCT URINALYSIS DIP (MANUAL ENTRY)
Bilirubin, UA: NEGATIVE
Glucose, UA: NEGATIVE mg/dL
Ketones, POC UA: NEGATIVE mg/dL
Nitrite, UA: NEGATIVE
Protein Ur, POC: NEGATIVE mg/dL
Spec Grav, UA: 1.015 (ref 1.010–1.025)
Urobilinogen, UA: 0.2 E.U./dL
pH, UA: 6.5 (ref 5.0–8.0)

## 2019-12-03 MED ORDER — NITROFURANTOIN MONOHYD MACRO 100 MG PO CAPS
100.0000 mg | ORAL_CAPSULE | Freq: Two times a day (BID) | ORAL | 0 refills | Status: AC
Start: 2019-12-03 — End: 2019-12-10

## 2019-12-03 NOTE — ED Triage Notes (Addendum)
Pain with urination and abd pressure since thanksgiving now having pain in LT flank. Pt was taking azo but has not taken today

## 2019-12-03 NOTE — ED Provider Notes (Signed)
[facility]    CSN: [id number] Arrival date & time: 12/03/19  1228      History   Chief Complaint No chief complaint on file.   HPI [name] Velazquez is a 44 y.o. female.   HPI Dysuria and frequency x 2 weeks. Over the last few days she noticed specks of blood in urine. Bilateral low back pain. No nausea or vomiting. She has forced fluids with water and cranberry juice which temporarily improved symptoms. Taken AZo, last dose yesterday. Mild chills and abdominal pressure x 3 days. No history if recurrent urinary infections. Past Medical History:  Diagnosis Date  . Anxiety   . Bladder leak   . Chronic diastolic heart failure (HCC)   . Depression   . Depression   . Hyperlipemia   . Lateral epicondylitis   . Lumbago   . Mitral regurgitation   . Numbness and tingling   . Numbness and tingling   . Phlebitis or thrombophlebitis of deep vessel of lower extremity (HCC)   . Stress incontinence in female   . Vitamin B12 deficiency   . Vitamin D deficiency     Patient Active Problem List   Diagnosis Date Noted  . Paresthesia 09/20/2018  . Pain of right eye 09/20/2018  . 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 APPENDECTOMY    . CESAREAN SECTION    . CHOLECYSTECTOMY    . DERMOID CYST  EXCISION    . INTESTINAL MALROTATION REPAIR    . lads    . PUBOVAGINAL SLING N/A 08/24/2014   Procedure: Leonides Grills;  Surgeon: Vena Austria, MD;  Location: [facility];  Service: Gynecology;  Laterality: N/A;    OB History    Gravida  5   Para  4   Term  4   Preterm      AB      Living  4     SAB      TAB      Ectopic      Multiple      Live Births  4            Home Medications    Prior to Admission medications   Medication Sig Start Date End Date Taking? Authorizing Provider  clonazePAM (KLONOPIN) 1 MG tablet Take 1 mg by mouth as needed for anxiety.    [provider]    DULoxetine (CYMBALTA) 60 MG capsule Take 1 capsule (60 mg total) by mouth daily. 09/20/18   Levert Feinstein, MD  nitrofurantoin, macrocrystal-monohydrate, (MACROBID) 100 MG capsule Take 1 capsule (100 mg total) by mouth 2 (two) times daily for 7 days. 12/03/19 12/10/19  Bing Neighbors, FNP  VITAMIN D PO Take 1 tablet by mouth daily.    [provider]    Family History Family History  Problem Relation Age of Onset  . Hypertension Mother        died at age 63  . Obesity Mother   . Heart disease Mother   . Breast cancer Maternal Grandmother 60  . Multiple sclerosis Father        died at age 4  . Multiple sclerosis Sister     Social History Social History   Tobacco Use  . Smoking status: Never Smoker  . Smokeless tobacco: Never Used  Substance Use Topics  . Alcohol use: No    Alcohol/week: 0.0 standard drinks  . Drug use: No  Allergies   Patient has no known allergies. Review of Systems Review of Systems Pertinent negatives listed in HPI  Physical Exam Triage Vital Signs ED Triage Vitals  Enc Vitals Group     BP 12/03/19 1412 129/83     Pulse Rate 12/03/19 1412 67     Resp 12/03/19 1412 14     Temp 12/03/19 1412 98.3 F (36.8 C)     Temp Source 12/03/19 1412 Oral     SpO2 12/03/19 1412 98 %     Weight 12/03/19 1418 190 lb (86.2 kg)     Height 12/03/19 1418 5\' 2"  (1.575 m)     Head Circumference --      Peak Flow --      Pain Score 12/03/19 1417 0     Pain Loc --      Pain Edu? --      Excl. in GC? --    No data found.  Updated Vital Signs BP 129/83 (BP Location: Right Arm)   Pulse 67   Temp 98.3 F (36.8 C) (Oral)   Resp 14   Ht 5\' 2"  (1.575 m)   Wt 190 lb (86.2 kg)   LMP 11/20/2019   SpO2 98%   BMI 34.75 kg/m   Visual Acuity Right Eye Distance:   Left Eye Distance:   Bilateral Distance:    Right Eye Near:   Left Eye Near:    Bilateral Near:     Physical Exam General appearance: alert, well developed, well nourished,  cooperative and in no distress Head: Normocephalic, without obvious abnormality, atraumatic Respiratory: Respirations even and unlabored, normal respiratory rate Heart: rate and rhythm normal. No gallop or murmurs noted on exam  Abdomen: BS +, no distention, no rebound tenderness, or no mass, + CVA tenderness  Extremities: No gross deformities Skin: Skin color, texture, turgor normal. No rashes seen  Psych: Appropriate mood and affect. Neurologic: Mental status: Alert, oriented to person, place, and time, thought content appropriate. UC Treatments / Results  Labs (all labs ordered are listed, but only abnormal results are displayed) Labs Reviewed  POCT URINALYSIS DIP (MANUAL ENTRY) - Abnormal; Notable for the following components:      Result Value   Clarity, UA cloudy (*)    Blood, UA small (*)    Leukocytes, UA Large (3+) (*)    All other components within normal limits  URINE CULTURE    EKG   Radiology No results found.  Procedures Procedures (including critical care time)  Medications Ordered in UC Medications - No data to display  Initial Impression / Assessment and Plan / UC Course  I have reviewed the triage vital signs and the nursing notes.  Pertinent labs & imaging results that were available during my care of the patient were reviewed by me and considered in my medical decision making (see chart for details).     Urine culture pending. Treating empirically, with Macrobid BID x 7 days. Red flags precautions given. Final Clinical Impressions(s) / UC Diagnoses   Final diagnoses:  Acute cystitis without hematuria   Discharge Instructions   None    ED Prescriptions    Medication Sig Dispense Auth. Provider   nitrofurantoin, macrocrystal-monohydrate, (MACROBID) 100 MG capsule Take 1 capsule (100 mg total) by mouth 2 (two) times daily for 7 days. 14 capsule , FNP     PDMP not reviewed this encounter.   11/22/2019, FNP 12/03/19  [phone]

## 2019-12-05 LAB — URINE CULTURE
Culture: >=100000 — AB
Special Requests: NORMAL

## 2020-04-04 ENCOUNTER — Other Ambulatory Visit: Payer: Self-pay | Admitting: *Deleted

## 2020-04-04 DIAGNOSIS — I83893 Varicose veins of bilateral lower extremities with other complications: Secondary | ICD-10-CM

## 2020-04-12 ENCOUNTER — Encounter: Payer: Self-pay | Admitting: Physician Assistant

## 2020-04-12 ENCOUNTER — Ambulatory Visit (INDEPENDENT_AMBULATORY_CARE_PROVIDER_SITE_OTHER): Payer: Medicaid Other | Admitting: Physician Assistant

## 2020-04-12 ENCOUNTER — Other Ambulatory Visit: Payer: Self-pay

## 2020-04-12 ENCOUNTER — Ambulatory Visit (HOSPITAL_COMMUNITY)
Admission: RE | Admit: 2020-04-12 | Discharge: 2020-04-12 | Disposition: A | Discharge status: Home / Self Care | Payer: Medicaid Other | Source: Ambulatory Visit | Attending: Vascular Surgery | Admitting: Vascular Surgery

## 2020-04-12 VITALS — BP 112/77 | HR 67 | Temp 98.2°F | Resp 20 | Ht 62.0 in | Wt 192.3 lb

## 2020-04-12 DIAGNOSIS — I83893 Varicose veins of bilateral lower extremities with other complications: Secondary | ICD-10-CM | POA: Diagnosis not present

## 2020-04-12 DIAGNOSIS — I872 Venous insufficiency (chronic) (peripheral): Secondary | ICD-10-CM | POA: Diagnosis not present

## 2020-04-12 NOTE — Progress Notes (Signed)
[facility] [place]   Reason for referral: achy heavy feeling thighs B  History of Present Illness  [name] Velazquez is a 45 y.o. female who presents with chief complaint: swollen leg.  Patient notes, onset of several months ago, associated with prolonged standing and sitting.  The patient has had  history of DVT, no history of varicose vein, no history of venous stasis ulcers, no history of  Lymphedema and + history of skin changes spider veins in lower legs.  She has given birth to 5 children.   The patient has not used compression stockings in the past.  Past Medical History:  Diagnosis Date  . Anxiety   . Bladder leak   . Chronic diastolic heart failure (HCC)   . Depression   . Depression   . Hyperlipemia   . Lateral epicondylitis   . Lumbago   . Mitral regurgitation   . Numbness and tingling   . Numbness and tingling   . Phlebitis or thrombophlebitis of deep vessel of lower extremity (HCC)   . Stress incontinence in female   . Vitamin B12 deficiency   . Vitamin D deficiency     Past Surgical History:  Procedure Laterality Date  . APPENDECTOMY    . CESAREAN SECTION    . CHOLECYSTECTOMY    . DERMOID CYST  EXCISION    . INTESTINAL MALROTATION REPAIR    . lads    . PUBOVAGINAL SLING N/A 08/24/2014   Procedure: Leonides Grills;  Surgeon: Vena Austria, MD;  Location: [facility];  Service: Gynecology;  Laterality: N/A;    Social History   Socioeconomic History  . Marital status: Single    Spouse name: Not on file  . Number of children: 5  . Years of education: 81  . Highest education level: Not on file  Occupational History  . Occupation: cleans buildings  Tobacco Use  . Smoking status: Never Smoker  . Smokeless tobacco: Never Used  Substance and Sexual Activity  . Alcohol use: No    Alcohol/week: 0.0 standard drinks  . Drug use: No  . Sexual activity: Yes  Other Topics Concern  . Not on file  Social History Narrative   Lives at  home with her children.   Right-handed.   2 cups coffee, 1 can of soda per day.   Social Determinants of Health   Financial Resource Strain: Not on file  Food Insecurity: Not on file  Transportation Needs: Not on file  Physical Activity: Not on file  Stress: Not on file  Social Connections: Not on file  Intimate Partner Violence: Not on file    Family History  Problem Relation Age of Onset  . Hypertension Mother        died at age 31  . Obesity Mother   . Heart disease Mother   . Breast cancer Maternal Grandmother 60  . Multiple sclerosis Father        died at age 44  . Multiple sclerosis Sister     Current Outpatient Medications on File Prior to Visit  Medication Sig Dispense Refill  . VITAMIN D PO Take 1 tablet by mouth daily. (Patient not taking: Reported on 04/12/2020)     No current facility-administered medications on file prior to visit.    Allergies as of 04/12/2020  . (No Known Allergies)     ROS:   General:  No weight loss, Fever, chills  HEENT: No recent headaches, no nasal bleeding, no visual changes, no  sore throat  Neurologic: No dizziness, blackouts, seizures. No recent symptoms of stroke or mini- stroke. No recent episodes of slurred speech, or temporary blindness.  Cardiac: No recent episodes of chest pain/pressure, no shortness of breath at rest.  No shortness of breath with exertion.  Denies history of atrial fibrillation or irregular heartbeat  Vascular: No history of rest pain in feet.  No history of claudication.  No history of non-healing ulcer, + history of DVT   Pulmonary: No home oxygen, no productive cough, no hemoptysis,  No asthma or wheezing  Musculoskeletal:  [ ]  Arthritis, [ ]  Low back pain,  [ ]  Joint pain  Hematologic:No history of hypercoagulable state.  No history of easy bleeding.  No history of anemia  Gastrointestinal: No hematochezia or melena,  No gastroesophageal reflux, no trouble swallowing  Urinary: [ ]  chronic  Kidney disease, [ ]  on HD - [ ]  MWF or [ ]  TTHS, [ ]  Burning with urination, [ ]  Frequent urination, [ ]  Difficulty urinating;   Skin: No rashes  Psychological: No history of anxiety,  No history of depression  Physical Examination  Vitals:   04/12/20 1041  BP: 112/77  Pulse: 67  Resp: 20  Temp: 98.2 F (36.8 C)  TempSrc: Temporal  SpO2: 99%  Weight: 192 lb 4.8 oz (87.2 kg)  Height: 5\' 2"  (1.575 m)    Body mass index is 35.17 kg/m.  General:  Alert and oriented, no acute distress HEENT: Normal Neck: No bruit or JVD Pulmonary: Clear to auscultation bilaterally Cardiac: Regular Rate and Rhythm without murmur Abdomen: Soft, non-tender, non-distended, no mass, no scars Skin: No rash          Extremity Pulses:  2+ radial, brachial, femoral, dorsalis pedis,  pulses bilaterally Musculoskeletal: No deformity or edema  Neurologic: Upper and lower extremity motor 5/5 and symmetric  DATA:   Venous Reflux Times  +--------------+---------+------+-----------+------------+--------+  RIGHT     Reflux NoRefluxReflux TimeDiameter cmsComments               Yes                   +--------------+---------+------+-----------+------------+--------+  CFV      no                         +--------------+---------+------+-----------+------------+--------+  FV mid    no                         +--------------+---------+------+-----------+------------+--------+  Popliteal   no                         +--------------+---------+------+-----------+------------+--------+  GSV at SFJ        yes  >500 ms   0.557        +--------------+---------+------+-----------+------------+--------+  GSV prox thigh      yes  >500 ms   0.398        +--------------+---------+------+-----------+------------+--------+  GSV  mid thigh       yes  >500 ms   0.424        +--------------+---------+------+-----------+------------+--------+  GSV dist thighno               0.419        +--------------+---------+------+-----------+------------+--------+  GSV at knee  no               0.398        +--------------+---------+------+-----------+------------+--------+  GSV  prox calf no               0.265        +--------------+---------+------+-----------+------------+--------+  SSV Pop Fossa no               0.28        +--------------+---------+------+-----------+------------+--------+  SSV prox calf no               0.21        +--------------+---------+------+-----------+------------+--------+  SSV mid calf no               0.26        +--------------+---------+------+-----------+------------+--------+     +--------------+---------+------+-----------+------------+--------+  LEFT     Reflux NoRefluxReflux TimeDiameter cmsComments               Yes                   +--------------+---------+------+-----------+------------+--------+  CFV      no                   880 ms   +--------------+---------+------+-----------+------------+--------+  FV mid    no                         +--------------+---------+------+-----------+------------+--------+  Popliteal   no                   579 ms   +--------------+---------+------+-----------+------------+--------+  GSV at SFJ  no               0.656        +--------------+---------+------+-----------+------------+--------+  GSV prox thigh      yes  >500 ms   0.547         +--------------+---------+------+-----------+------------+--------+  GSV mid thigh       yes  >500 ms   0.56        +--------------+---------+------+-----------+------------+--------+  GSV dist thigh      yes  >500 ms   0.459        +--------------+---------+------+-----------+------------+--------+  GSV at knee        yes  >500 ms   0.554        +--------------+---------+------+-----------+------------+--------+  GSV prox calf       yes  >500 ms   0.765        +--------------+---------+------+-----------+------------+--------+  SSV Pop Fossa no               0.337        +--------------+---------+------+-----------+------------+--------+  SSV prox calf no               0.3         +--------------+---------+------+-----------+------------+--------+  SSV mid calf no               0.31        +--------------+---------+------+-----------+------------+--------+    Summary:  Right:  - No evidence of deep vein thrombosis seen in the right lower extremity,  from the common femoral through the popliteal veins.  - No evidence of superficial venous thrombosis in the right lower  extremity.  - No evidence of deep vein reflux.  - Superficial vein reflux in the SFJ and GSV.     Left:  - No evidence of deep vein thrombosis seen in the left lower extremity,  from the common femoral through the popliteal veins.  - No evidence of superficial venous thrombosis in the left lower  extremity.  - No evidence of significant deep vein  reflux.  - Superficial vein reflux in the SFJ and GSV.    Assessment/Plan  Venous reflux B LE  The GSV is not large enough to consider intervention with laser ablation She has areas of GSV reflux without SFJ evolvement on the left  No evidence of DVT B LE Multiple areas of telangectasia's/spider veins I  will have Harriett SineNancy our sclera therapy RN talk with her. She has palpable pedal pulses without evidence of non healing wounds or chronic venous reflux.     She will be measured for thigh high compression 15-20 mmhg.  If she develops edema on a dailay basis, weeping skin with edema or tibial wounds she will call us for a f/u visit.  Other wise she will f/u PRN   Mosetta PigeonEmma Maureen Sadye Kiernan PA-C [facility] [place] Office: [phone]  MD in clinic [place]

## 2021-05-06 ENCOUNTER — Other Ambulatory Visit (HOSPITAL_COMMUNITY)
Admission: RE | Admit: 2021-05-06 | Discharge: 2021-05-06 | Disposition: A | Discharge status: Home / Self Care | Payer: Medicaid Other | Source: Ambulatory Visit | Attending: Women's Health | Admitting: Women's Health

## 2021-05-06 ENCOUNTER — Encounter: Payer: Self-pay | Admitting: Women's Health

## 2021-05-06 ENCOUNTER — Ambulatory Visit: Payer: Medicaid Other | Admitting: Women's Health

## 2021-05-06 VITALS — BP 136/86 | HR 72 | Ht 62.0 in | Wt 185.0 lb

## 2021-05-06 DIAGNOSIS — N920 Excessive and frequent menstruation with regular cycle: Secondary | ICD-10-CM | POA: Diagnosis not present

## 2021-05-06 DIAGNOSIS — R1032 Left lower quadrant pain: Secondary | ICD-10-CM

## 2021-05-06 DIAGNOSIS — Z124 Encounter for screening for malignant neoplasm of cervix: Secondary | ICD-10-CM

## 2021-05-06 DIAGNOSIS — N3000 Acute cystitis without hematuria: Secondary | ICD-10-CM | POA: Diagnosis not present

## 2021-05-06 DIAGNOSIS — Z113 Encounter for screening for infections with a predominantly sexual mode of transmission: Secondary | ICD-10-CM

## 2021-05-06 DIAGNOSIS — N941 Unspecified dyspareunia: Secondary | ICD-10-CM

## 2021-05-06 DIAGNOSIS — N923 Ovulation bleeding: Secondary | ICD-10-CM

## 2021-05-06 LAB — POCT URINALYSIS DIPSTICK
Blood, UA: NEGATIVE
Glucose, UA: NEGATIVE
Ketones, UA: NEGATIVE
Leukocytes, UA: NEGATIVE
Nitrite, UA: NEGATIVE
Protein, UA: NEGATIVE

## 2021-05-06 NOTE — Progress Notes (Signed)
? ?GYN VISIT ?Patient name: [name] Velazquez MRN [id number]  Date of birth: May 31, 1975 ?Chief Complaint:   ?bleeding between period (Had problem uti and yeast infection) ? ?History of Present Illness:   ?[name] Velazquez is a 46 y.o. [id number] Caucasian female being seen today as new pt. Reports UTI 2wks ago, PCP rx'd antibiotics, took all but 6, got yeast infection, took 2 diflucan, UTI sx returned, took remaining 6 antibiotics and sx gone now. Bladder sling 37yrs ago, worked great, now w/in last month she has had urge incontinence, no stress incontinence. Sometimes doesn't even feel urge, then it hits her and she is leaking on way to bathroom. Periods regular, last 3d, heavy- wears 2 super or super plus tampons at a time, changes q 2-3hrs, sometimes soils onto clothes. No real cramps w/ period.  Past 2 cycles has started having intermenstrual spotting, LLQ pain, generalized dyspareunia. No new sex partner. Pap >36yrs ago, wnl. Dermoid cyst removed 8-84yrs ago, was told she had endometriosis. Wants full STD screening.  ? Patient's last menstrual period was 04/27/2021. ?The current method of family planning is tubal ligation.  ?Last pap >58yrs ago. Results were:  neg in [place] ? ? ?  05/06/2021  ?  2:55 PM  ?Depression screen PHQ 2/9  ?Decreased Interest 3  ?Down, Depressed, Hopeless 3  ?PHQ - 2 Score 6  ?Altered sleeping 2  ?Tired, decreased energy 3  ?Change in appetite 1  ?Feeling bad or failure about yourself  3  ?Trouble concentrating 2  ?Moving slowly or fidgety/restless 2  ?Suicidal thoughts 0  ?PHQ-9 Score 19  ? ?  ? ?  05/06/2021  ?  2:55 PM  ?GAD 7 : Generalized Anxiety Score  ?Nervous, Anxious, on Edge 2  ?Control/stop worrying 3  ?Worry too much - different things 3  ?Trouble relaxing 2  ?Restless 2  ?Easily annoyed or irritable 2  ?Afraid - awful might happen 1  ?Total GAD 7 Score 15  ? ? ? ?Review of Systems:   ?Pertinent items are noted in HPI ?Denies fever/chills, dizziness, headaches, visual disturbances,  fatigue, shortness of breath, chest pain, abdominal pain, vomiting, abnormal vaginal discharge/itching/odor/irritation, problems with periods, bowel movements, urination, or intercourse unless otherwise stated above.  ?Pertinent History Reviewed:  ?Reviewed past medical,surgical, social, obstetrical and family history.  ?Reviewed problem list, medications and allergies. ?Physical Assessment:  ? ?Vitals:  ? 05/06/21 1435  ?BP: 136/86  ?Pulse: 72  ?Weight: 185 lb (83.9 kg)  ?Height: 5\' 2"  (1.575 m)  ?Body mass index is 33.84 kg/m?. ? ?     Physical Examination:  ? General appearance: alert, well appearing, and in no distress ? Mental status: alert, oriented to person, place, and time ? Skin: warm & dry  ? Cardiovascular: normal heart rate noted ? Respiratory: normal respiratory effort, no distress ? Abdomen: soft, non-tender  ? Pelvic: VULVA: normal appearing vulva with no masses, tenderness or lesions, VAGINA: normal appearing vagina with normal color and discharge, no lesions, CERVIX: normal appearing cervix without discharge or lesions, UTERUS: uterus feels enlarged, slightly tender ADNEXA: normal adnexa in size, no masses; slightly tender Lt ? Extremities: no edema  ? ?Chaperone:   ? ?Results for orders placed or performed in visit on 05/06/21 (from the past 24 hour(s))  ?POCT Urinalysis Dipstick  ? Collection Time: 05/06/21  3:23 PM  ?Result Value Ref Range  ? Color, UA    ? Clarity, UA    ? Glucose, UA Negative Negative  ?  Bilirubin, UA    ? Ketones, UA neg   ? Spec Grav, UA    ? Blood, UA neg   ? pH, UA    ? Protein, UA Negative Negative  ? Urobilinogen, UA    ? Nitrite, UA neg   ? Leukocytes, UA Negative Negative  ? Appearance    ? Odor    ?  ?Assessment & Plan:  ?1) Menorrhagia w/ regular cycle, now w/ intermenstrual spotting, LLQ pain, dyspareunia, urge UI> pap w/ STD screen, will get pelvic u/s and f/u w/ MD after ? ?2) H/O dermoid cyst and endometriosis per pt ? ?3) Recent UTI and yeast  infection ? ?4) STD screen ? ?Meds: No orders of the defined types were placed in this encounter. ? ? ?Orders Placed This Encounter  ?Procedures  ? US PELVIS (TRANSABDOMINAL ONLY)  ? US PELVIS TRANSVAGINAL NON-OB (TV ONLY)  ? HIV Antibody (routine testing w rflx)  ? RPR  ? Hepatitis B surface antigen  ? POCT Urinalysis Dipstick  ? ? ?Return for 1st available, US:GYN and f/u w/ MD only after; get pap from recent provider. ? ?Cheral Marker CNM, WHNP-BC ?05/06/2021 ?3:33 PM  ?

## 2021-05-07 LAB — HEPATITIS B SURFACE ANTIGEN: Hepatitis B Surface Ag: NEGATIVE

## 2021-05-07 LAB — HIV ANTIBODY (ROUTINE TESTING W REFLEX): HIV Screen 4th Generation wRfx: NONREACTIVE

## 2021-05-07 LAB — RPR: RPR Ser Ql: NONREACTIVE

## 2021-05-08 LAB — CYTOLOGY - PAP
Adequacy: ABSENT
Chlamydia: NEGATIVE
Comment: NEGATIVE
Comment: NEGATIVE
Comment: NEGATIVE
Comment: NORMAL
Diagnosis: NEGATIVE
High risk HPV: NEGATIVE
Neisseria Gonorrhea: NEGATIVE
Trichomonas: NEGATIVE

## 2021-05-20 ENCOUNTER — Other Ambulatory Visit: Payer: Self-pay | Admitting: Women's Health

## 2021-05-20 ENCOUNTER — Telehealth: Payer: Self-pay | Admitting: Women's Health

## 2021-05-20 MED ORDER — FLUCONAZOLE 150 MG PO TABS: 150.0000 mg | ORAL_TABLET | Freq: Once | ORAL | 0 refills | Status: AC

## 2021-05-20 NOTE — Telephone Encounter (Signed)
Pt aware rx sent in. 

## 2021-05-20 NOTE — Telephone Encounter (Signed)
Pt has thick white discharge with vulvar itching and irritation. Would like Diflucan sent in. Will route to provider for rx.  ?

## 2021-05-20 NOTE — Telephone Encounter (Signed)
Patient believes she has a yeast infection. Little white discharge when wiping and feels like razor cuts and itchy. She would like to see if you could call in fluconazole.Please advise.  ?

## 2021-06-05 ENCOUNTER — Ambulatory Visit (INDEPENDENT_AMBULATORY_CARE_PROVIDER_SITE_OTHER): Payer: Medicaid Other

## 2021-06-05 ENCOUNTER — Ambulatory Visit (INDEPENDENT_AMBULATORY_CARE_PROVIDER_SITE_OTHER): Payer: Medicaid Other | Admitting: Obstetrics & Gynecology

## 2021-06-05 ENCOUNTER — Encounter: Payer: Self-pay | Admitting: Obstetrics & Gynecology

## 2021-06-05 VITALS — BP 102/67 | HR 73 | Ht 62.0 in | Wt 184.0 lb

## 2021-06-05 DIAGNOSIS — N923 Ovulation bleeding: Secondary | ICD-10-CM | POA: Diagnosis not present

## 2021-06-05 DIAGNOSIS — N920 Excessive and frequent menstruation with regular cycle: Secondary | ICD-10-CM | POA: Diagnosis not present

## 2021-06-05 DIAGNOSIS — N3941 Urge incontinence: Secondary | ICD-10-CM | POA: Diagnosis not present

## 2021-06-05 DIAGNOSIS — N946 Dysmenorrhea, unspecified: Secondary | ICD-10-CM

## 2021-06-05 DIAGNOSIS — N840 Polyp of corpus uteri: Secondary | ICD-10-CM

## 2021-06-05 DIAGNOSIS — N941 Unspecified dyspareunia: Secondary | ICD-10-CM | POA: Diagnosis not present

## 2021-06-05 DIAGNOSIS — R1032 Left lower quadrant pain: Secondary | ICD-10-CM | POA: Diagnosis not present

## 2021-06-05 MED ORDER — SOLIFENACIN SUCCINATE 10 MG PO TABS: 10.0000 mg | ORAL_TABLET | Freq: Every day | ORAL | 11 refills | Status: AC

## 2021-06-05 NOTE — Progress Notes (Signed)
Follow up appointment for results: sonogram  Chief Complaint  Patient presents with   Follow-up    [place]    Blood pressure 102/67, pulse 73, height 5\' 2"  (1.575 m), weight 184 lb (83.5 kg), last menstrual period 05/25/2021, not currently breastfeeding.  05/27/2021 PELVIS TRANSVAGINAL NON-OB (TV ONLY)  Result Date: 06/05/2021 Images from the original result were not included.  ..an 06/07/2021 of Ultrasound Medicine Financial trader) accredited practice Center for [facility] [address] [place] Ordering Provider: [id number], CNM                                                                                                   GYNECOLOGIC SONOGRAM [name] Velazquez is a 46 y.o. 59 LMP 05/25/2021 She is here for a pelvic sonogram for menorrhagia,dyspareunia,LLQ pain. Uterus                      7.8 x 6.4 x 7.3 cm, Total uterine volume 191 cc: Heterogeneous anteverted uterus with linear striations,anterior fundal right intramural fibroid 1 x 1 x 1.6 cm Endometrium          13.4 mm, symmetrical, three echogenic masses within the endometrium with color flow (#1) 1.9 x 1.8  x .9 cm,(#2) 1.3 x 1.6 x .8 cm (#3) 1.8 x .8 x 1.5 cm Right ovary             3.1 x 1.8 x 2.5 cm, normal Left ovary                3.2 x 1.8 x 3.5 cm, normal No free fluid Technician Comments: PELVIC 05/27/2021 TA/TV: Heterogeneous anteverted uterus with linear striations,anterior fundal right intramural fibroid 1 x 1 x 1.6 cm,EEC 13.4 mm,three echogenic masses within the endometrium with color flow (#1) 1.9 x 1.8  x .9 cm,(#2) 1.3 x 1.6 x .8 cm (#3) 1.8 x .8 x 1.5 cm,normal ovaries,ovaries appear mobile,no pain during ultrasound,no free fluid Chaperone [address] 06/05/2021 3:14 PM Clinical Impression and recommendations: I have reviewed the sonogram results above, combined with the patient's current clinical course, below are my impressions and any appropriate recommendations for management based on  the sonographic findings. Uterus normal size shape and contour with small clinically insignificant fibroid, suggestion of minimal adenomyosis Endometrium 3 endometrial polyps are seen Ovaries: both ovaries are normal size shape and morphology 06/07/2021 06/05/2021 3:37 PM  06/07/2021 PELVIS (TRANSABDOMINAL ONLY)  Result Date: 06/05/2021 Images from the original result were not included.  ..an 06/07/2021 of Ultrasound Medicine Financial trader) accredited practice Center for [facility] [address] [place] Ordering Provider: [id number], CNM  GYNECOLOGIC SONOGRAM [name] Velazquez is a 46 y.o. [id number] LMP 05/25/2021 She is here for a pelvic sonogram for menorrhagia,dyspareunia,LLQ pain. Uterus                      7.8 x 6.4 x 7.3 cm, Total uterine volume 191 cc: Heterogeneous anteverted uterus with linear striations,anterior fundal right intramural fibroid 1 x 1 x 1.6 cm Endometrium          13.4 mm, symmetrical, three echogenic masses within the endometrium with color flow (#1) 1.9 x 1.8  x .9 cm,(#2) 1.3 x 1.6 x .8 cm (#3) 1.8 x .8 x 1.5 cm Right ovary             3.1 x 1.8 x 2.5 cm, normal Left ovary                3.2 x 1.8 x 3.5 cm, normal No free fluid Technician Comments: PELVIC US TA/TV: Heterogeneous anteverted uterus with linear striations,anterior fundal right intramural fibroid 1 x 1 x 1.6 cm,EEC 13.4 mm,three echogenic masses within the endometrium with color flow (#1) 1.9 x 1.8  x .9 cm,(#2) 1.3 x 1.6 x .8 cm (#3) 1.8 x .8 x 1.5 cm,normal ovaries,ovaries appear mobile,no pain during ultrasound,no free fluid Chaperone [address] Flora Lipps 06/05/2021 3:14 PM Clinical Impression and recommendations: I have reviewed the sonogram results above, combined with the patient's current clinical course, below are my impressions and any appropriate recommendations for management  based on the sonographic findings. Uterus normal size shape and contour with small clinically insignificant fibroid, suggestion of minimal adenomyosis Endometrium 3 endometrial polyps are seen Ovaries: both ovaries are normal size shape and morphology Lazaro Arms 06/05/2021 3:37 PM     MEDS ordered this encounter: Meds ordered this encounter  Medications   solifenacin (VESICARE) 10 MG tablet    Sig: Take 1 tablet (10 mg total) by mouth daily.    Dispense:  30 tablet    Refill:  11    Orders for this encounter: No orders of the defined types were placed in this encounter.   Impression + Management Plan   ICD-10-CM   1. Endometrial polyp, 3  N84.0    3 seen on sonogram    2. Dyspareunia, female, only positional, does  not happen otherwise  N94.10     3. Dysmenorrhea  N94.6     4. Urge incontinence  N39.41    at least once per day, sometimes more, not stress associated, S/P sling 2016, trial vesicare     Recommend hysteroscopy D&C endometrial ablation  Follow Up: Return in about 23 days (around 06/28/2021) for Post Op, with Dr Despina Hidden.     All questions were answered.  Past Medical History:  Diagnosis Date   Anxiety    Bladder leak    Chronic diastolic heart failure (HCC)    Depression    Depression    Hyperlipemia    Lateral epicondylitis    Lumbago    Mitral regurgitation    Numbness and tingling    Numbness and tingling    Phlebitis or thrombophlebitis of deep vessel of lower extremity (HCC)    Stress incontinence in female    Vitamin B12 deficiency    Vitamin D deficiency     Past Surgical History:  Procedure Laterality Date   APPENDECTOMY     CESAREAN SECTION     CHOLECYSTECTOMY     DERMOID CYST  EXCISION  INTESTINAL MALROTATION REPAIR     lads     PUBOVAGINAL SLING N/A 08/24/2014   Procedure: Leonides GrillsPUBO-VAGINAL SLING;  Surgeon: Vena AustriaAndreas Staebler, MD;  Location: [facility] ORS;  Service: Gynecology;  Laterality: N/A;    OB History     Gravida  5   Para   4   Term  4   Preterm      AB      Living  5      SAB      IAB      Ectopic      Multiple      Live Births  4           No Known Allergies  Social History   Socioeconomic History   Marital status: Single    Spouse name: Not on file   Number of children: 5   Years of education: 12   Highest education level: Not on file  Occupational History   Occupation: cleans buildings  Tobacco Use   Smoking status: Never   Smokeless tobacco: Never  Vaping Use   Vaping Use: Never used  Substance and Sexual Activity   Alcohol use: No    Alcohol/week: 0.0 standard drinks   Drug use: No   Sexual activity: Yes    Birth control/protection: Surgical  Other Topics Concern   Not on file  Social History Narrative   Lives at home with her children.   Right-handed.   2 cups coffee, 1 can of soda per day.   Social Determinants of Health   Financial Resource Strain: Medium Risk   Difficulty of Paying Living Expenses: Somewhat hard  Food Insecurity: Food Insecurity Present   Worried About Running Out of Food in the Last Year: Sometimes true   Ran Out of Food in the Last Year: Sometimes true  Transportation Needs: No Transportation Needs   Lack of Transportation (Medical): No   Lack of Transportation (Non-Medical): No  Physical Activity: Sufficiently Active   Days of Exercise per Week: 6 days   Minutes of Exercise per Session: 30 min  Stress: Stress Concern Present   Feeling of Stress : Very much  Social Connections: Socially Integrated   Frequency of Communication with Friends and Family: More than three times a week   Frequency of Social Gatherings with Friends and Family: Once a week   Attends Religious Services: More than 4 times per year   Active Member of [employer] or Organizations: Yes   Attends Engineer, structuralClub or Organization Meetings: More than 4 times per year   Marital Status: Living with partner    Family History  Problem Relation Age of Onset   Breast cancer  Maternal Grandmother [id number]   Multiple sclerosis Father        died at age 45   Hypertension Mother        died at age 45   Obesity Mother    Heart disease Mother    Multiple sclerosis Sister

## 2021-06-05 NOTE — Progress Notes (Signed)
PELVIC US TA/TV: Heterogeneous anteverted uterus with linear striations,anterior fundal right intramural fibroid 1 x 1 x 1.6 cm,EEC 13.4 mm,three echogenic masses within the endometrium with color flow (#1) 1.9 x 1.8  x .9 cm,(#2) 1.3 x 1.6 x .8 cm (#3) 1.8 x .8 x 1.5 cm,normal ovaries,ovaries appear mobile,no pain during ultrasound,no free fluid   Chaperone Peggy

## 2021-06-06 ENCOUNTER — Encounter: Payer: Self-pay | Admitting: Obstetrics & Gynecology

## 2021-06-17 ENCOUNTER — Encounter (HOSPITAL_COMMUNITY): Payer: Medicaid Other

## 2021-06-26 NOTE — Patient Instructions (Signed)
Your procedure is scheduled on: 06/13/2021  [facility] at     12:30 PM.  Call this number if you have problems the morning of surgery: [phone]   Remember:   Do not Eat or Drink after midnight         No Smoking the morning of surgery  :  Take these medicines the morning of surgery with A SIP OF WATER: none   Do not wear jewelry, make-up or nail polish.  Do not wear lotions, powders, or perfumes. You may wear deodorant.  Do not shave 48 hours prior to surgery. Men may shave face and neck.  Do not bring valuables to the hospital.  Contacts, dentures or bridgework may not be worn into surgery.  Leave suitcase in the car. After surgery it may be brought to your room.  For patients admitted to the hospital, checkout time is 11:00 AM the day of discharge.   Patients discharged the day of surgery will not be allowed to drive home.    Special Instructions: Shower using CHG night before surgery and shower the day of surgery use CHG.  Use special wash - you have one bottle of CHG for all showers.  You should use approximately 1/2 of the bottle for each shower.  How to Use Chlorhexidine for Bathing Chlorhexidine gluconate (CHG) is a germ-killing (antiseptic) solution that is used to clean the skin. It can get rid of the bacteria that normally live on the skin and can keep them away for about 24 hours. To clean your skin with CHG, you may be given: A CHG solution to use in the shower or as part of a sponge bath. A prepackaged cloth that contains CHG. Cleaning your skin with CHG may help lower the risk for infection: While you are staying in the intensive care unit of the hospital. If you have a vascular access, such as a central line, to provide short-term or long-term access to your veins. If you have a catheter to drain urine from your bladder. If you are on a ventilator. A ventilator is a machine that helps you breathe by moving air in and out of your lungs. After  surgery. What are the risks? Risks of using CHG include: A skin reaction. Hearing loss, if CHG gets in your ears and you have a perforated eardrum. Eye injury, if CHG gets in your eyes and is not rinsed out. The CHG product catching fire. Make sure that you avoid smoking and flames after applying CHG to your skin. Do not use CHG: If you have a chlorhexidine allergy or have previously reacted to chlorhexidine. On babies younger than 452 months of age. How to use CHG solution Use CHG only as told by your health care provider, and follow the instructions on the label. Use the full amount of CHG as directed. Usually, this is one bottle. During a shower Follow these steps when using CHG solution during a shower (unless your health care provider gives you different instructions): Start the shower. Use your normal soap and shampoo to wash your face and hair. Turn off the shower or move out of the shower stream. Pour the CHG onto a clean washcloth. Do not use any type of brush or rough-edged sponge. Starting at your neck, lather your body down to your toes. Make sure you follow these instructions: If you will be having surgery, pay special attention to the part of your body where you will be having surgery.  Scrub this area for at least 1 minute. Do not use CHG on your head or face. If the solution gets into your ears or eyes, rinse them well with water. Avoid your genital area. Avoid any areas of skin that have broken skin, cuts, or scrapes. Scrub your back and under your arms. Make sure to wash skin folds. Let the lather sit on your skin for 1-2 minutes or as long as told by your health care provider. Thoroughly rinse your entire body in the shower. Make sure that all body creases and crevices are rinsed well. Dry off with a clean towel. Do not put any substances on your body afterward--such as powder, lotion, or perfume--unless you are told to do so by your health care provider. Only use lotions  that are recommended by the manufacturer. Put on clean clothes or pajamas. If it is the night before your surgery, sleep in clean sheets.  During a sponge bath Follow these steps when using CHG solution during a sponge bath (unless your health care provider gives you different instructions): Use your normal soap and shampoo to wash your face and hair. Pour the CHG onto a clean washcloth. Starting at your neck, lather your body down to your toes. Make sure you follow these instructions: If you will be having surgery, pay special attention to the part of your body where you will be having surgery. Scrub this area for at least 1 minute. Do not use CHG on your head or face. If the solution gets into your ears or eyes, rinse them well with water. Avoid your genital area. Avoid any areas of skin that have broken skin, cuts, or scrapes. Scrub your back and under your arms. Make sure to wash skin folds. Let the lather sit on your skin for 1-2 minutes or as long as told by your health care provider. Using a different clean, wet washcloth, thoroughly rinse your entire body. Make sure that all body creases and crevices are rinsed well. Dry off with a clean towel. Do not put any substances on your body afterward--such as powder, lotion, or perfume--unless you are told to do so by your health care provider. Only use lotions that are recommended by the manufacturer. Put on clean clothes or pajamas. If it is the night before your surgery, sleep in clean sheets. How to use CHG prepackaged cloths Only use CHG cloths as told by your health care provider, and follow the instructions on the label. Use the CHG cloth on clean, dry skin. Do not use the CHG cloth on your head or face unless your health care provider tells you to. When washing with the CHG cloth: Avoid your genital area. Avoid any areas of skin that have broken skin, cuts, or scrapes. Before surgery Follow these steps when using a CHG cloth to  clean before surgery (unless your health care provider gives you different instructions): Using the CHG cloth, vigorously scrub the part of your body where you will be having surgery. Scrub using a back-and-forth motion for 3 minutes. The area on your body should be completely wet with CHG when you are done scrubbing. Do not rinse. Discard the cloth and let the area air-dry. Do not put any substances on the area afterward, such as powder, lotion, or perfume. Put on clean clothes or pajamas. If it is the night before your surgery, sleep in clean sheets.  For general bathing Follow these steps when using CHG cloths for general bathing (unless your health care  provider gives you different instructions). Use a separate CHG cloth for each area of your body. Make sure you wash between any folds of skin and between your fingers and toes. Wash your body in the following order, switching to a new cloth after each step: The front of your neck, shoulders, and chest. Both of your arms, under your arms, and your hands. Your stomach and groin area, avoiding the genitals. Your right leg and foot. Your left leg and foot. The back of your neck, your back, and your buttocks. Do not rinse. Discard the cloth and let the area air-dry. Do not put any substances on your body afterward--such as powder, lotion, or perfume--unless you are told to do so by your health care provider. Only use lotions that are recommended by the manufacturer. Put on clean clothes or pajamas. Contact a health care provider if: Your skin gets irritated after scrubbing. You have questions about using your solution or cloth. You swallow any chlorhexidine. Call your local poison control center [phone] in the [place] Get help right away if: Your eyes itch badly, or they become very red or swollen. Your skin itches badly and is red or swollen. Your hearing changes. You have trouble seeing. You have swelling or tingling in your mouth or  throat. You have trouble breathing. These symptoms may represent a serious problem that is an emergency. Do not wait to see if the symptoms will go away. Get medical help right away. Call your local emergency services (911 in the [place] Do not drive yourself to the hospital. Summary Chlorhexidine gluconate (CHG) is a germ-killing (antiseptic) solution that is used to clean the skin. Cleaning your skin with CHG may help to lower your risk for infection. You may be given CHG to use for bathing. It may be in a bottle or in a prepackaged cloth to use on your skin. Carefully follow your health care provider's instructions and the instructions on the product label. Do not use CHG if you have a chlorhexidine allergy. Contact your health care provider if your skin gets irritated after scrubbing. This information is not intended to replace advice given to you by your health care provider. Make sure you discuss any questions you have with your health care provider. Document Revised: 03/05/2020 Document Reviewed: 03/05/2020 Elsevier Patient Education  2023 [employer] Dilation and Curettage, Care After The following information offers guidance on how to care for yourself after your procedure. Your doctor may also give you more specific instructions. If you have problems or questions, contact your doctor. What can I expect after the procedure? After the procedure, it is common to have: Mild pain or cramps. Some bleeding or spotting from the vagina. These may last for up to 2 weeks. Follow these instructions at home: Medicines Take over-the-counter and prescription medicines only as told by your doctor. If told, take steps to prevent problems with pooping (constipation). You may need to: Drink enough fluid to keep your pee (urine) pale yellow. Take medicines. You will be told what medicines to take. Eat foods that are high in fiber. These include beans, whole grains, and fresh fruits and  vegetables. Limit foods that are high in fat and sugar. These include fried or sweet foods. Ask your doctor if you should avoid driving or using machines while you are taking your medicine. Activity  If you were given a medicine to help you relax (sedative) during your procedure, it can affect you for many hours. Do not drive or use  machinery until your doctor says that it is safe. Rest as told by your doctor. Get up to take short walks every 1-2 hours. Ask for help if you feel weak or unsteady. Do not lift anything that is heavier than 10 lb (4.5 kg), or the limit that you are told. Return to your normal activities when your doctor says that it is safe. Lifestyle For at least 2 weeks, or as long as told by your doctor: Do not douche. Do not use tampons. Do not have sex. General instructions Do not take baths, swim, or use a hot tub. Ask your doctor if you may take showers. Do not smoke or use any products that contain nicotine or tobacco. These can delay healing. If you need help quitting, ask your doctor. Wear compression stockings as told by your doctor. It is up to you to get the results of your procedure. Ask how to get your results when they are ready. Keep all follow-up visits. Contact a doctor if: You have very bad cramps that get worse or do not get better with medicine. You have very bad pain in your belly (abdomen). You cannot drink fluids without vomiting. You have pain in the area just above your thighs. You have fluid from your vagina that smells bad. You have a rash. Get help right away if: You are bleeding a lot from your vagina. This means soaking more than one sanitary pad in 1 hour, and this happens for 2 hours in a row. You have a fever that is above 100.28F (38C). Your belly feels very tender or hard. You have chest pain. You have trouble breathing. You feel dizzy or light-headed. You faint. You have pain in your neck or shoulder area. These symptoms may be  an emergency. Get help right away. Call your local emergency services (911 in the [place] Do not wait to see if the symptoms will go away. Do not drive yourself to the hospital. Summary After your procedure, it is common to have pain or cramping. It is also common to have bleeding or spotting from your vagina. Rest as told. Get up to take short walks every 1-2 hours. Do not lift anything that is heavier than 10 lb (4.5 kg), or the limit that you are told. Get help right away if you have problems from the procedure. Ask your doctor what problems to watch for. This information is not intended to replace advice given to you by your health care provider. Make sure you discuss any questions you have with your health care provider. Document Revised: 12/14/2019 Document Reviewed: 12/14/2019 Elsevier Patient Education  2023 [employer] Hysteroscopy Hysteroscopy is a procedure used to look inside a woman's womb (uterus). This may be done for various reasons, including: To look for tumors and other growths in the uterus. To evaluate abnormal bleeding, fibroid tumors, polyps, scar tissue, or uterine cancer. To determine why a woman is unable to get pregnant or has had repeated pregnancy losses. To locate an IUD (intrauterine device). To place a birth control device into the fallopian tubes. During this procedure, a thin, flexible tube with a small light and camera (hysteroscope) is used to examine the uterus. The camera sends images to a monitor in the room so that your health care provider can view the inside of your uterus. A hysteroscopy should be done right after a menstrual period. Tell a health care provider about: Any allergies you have. All medicines you are taking, including vitamins, herbs, eye drops,  creams, and over-the-counter medicines. Any problems you or family members have had with anesthetic medicines. Any blood disorders you have. Any surgeries you have had. Any medical conditions  you have. Whether you are pregnant or may be pregnant. Whether you have been diagnosed with an STI (sexually transmitted infection) or you think you have an STI. What are the risks? Generally, this is a safe procedure. However, problems may occur, including: Excessive bleeding. Infection. Damage to the uterus or other structures or organs. Allergic reaction to medicines or fluids that are used in the procedure. What happens before the procedure? Staying hydrated Follow instructions from your health care provider about hydration, which may include: Up to 2 hours before the procedure - you may continue to drink clear liquids, such as water, clear fruit juice, black coffee, and plain tea. Eating and drinking restrictions Follow instructions from your health care provider about eating and drinking, which may include: 8 hours before the procedure - stop eating solid foods and drink clear liquids only. 2 hours before the procedure - stop drinking clear liquids. Medicines Ask your health care provider about: Changing or stopping your regular medicines. This is especially important if you are taking diabetes medicines or blood thinners. Taking medicines such as aspirin and ibuprofen. These medicines can thin your blood. Do not take these medicines unless your health care provider tells you to take them. Taking over-the-counter medicines, vitamins, herbs, and supplements. Medicine may be placed in your cervix the day before the procedure. This medicine causes the cervix to open (dilate). The larger opening makes it easier for the hysteroscope to be inserted into the uterus during the procedure. General instructions Ask your health care provider: What steps will be taken to help prevent infection. These steps may include: Washing skin with a germ-killing soap. Taking antibiotic medicine. Do not use any products that contain nicotine or tobacco for at least 4 weeks before the procedure. These  products include cigarettes, chewing tobacco, and vaping devices, such as e-cigarettes. If you need help quitting, ask your health care provider. Plan to have a responsible adult take you home from the hospital or clinic. Plan to have a responsible adult care for you for the time you are told after you leave the hospital or clinic. This is important. Empty your bladder before the procedure begins. What happens during the procedure? An IV will be inserted into one of your veins. You may be given: A medicine to help you relax (sedative). A medicine that numbs the area around the cervix (local anesthetic). A medicine to make you fall asleep (general anesthetic). A hysteroscope will be inserted through your vagina and into your uterus. Air or fluid will be used to enlarge your uterus to allow your health care provider to see it better. The amount of fluid used will be carefully checked throughout the procedure. In some cases, tissue may be gently scraped from inside the uterus and sent to a lab for testing (biopsy). The procedure may vary among health care providers and hospitals. What can I expect after the procedure? Your blood pressure, heart rate, breathing rate, and blood oxygen level will be monitored until you leave the hospital or clinic. You may have cramps. You may be given medicines for this. You may have bleeding, which may vary from light spotting to menstrual-like bleeding. This is normal. If you had a biopsy, it is up to you to get the results. Ask your health care provider, or the department that is doing the  procedure, when your results will be ready. Follow these instructions at home: Activity Rest as told by your health care provider. Return to your normal activities as told by your health care provider. Ask your health care provider what activities are safe for you. If you were given a sedative during the procedure, it can affect you for several hours. Do not drive or operate  machinery until your health care provider says that it is safe. Medicines Do not take aspirin or other NSAIDs during recovery, as told by your healthcare provider. It can increase the risk of bleeding. Ask your health care provider if the medicine prescribed to you: Requires you to avoid driving or using machinery. Can cause constipation. You may need to take these actions to prevent or treat constipation: Drink enough fluid to keep your urine pale yellow. Take over-the-counter or prescription medicines. Eat foods that are high in fiber, such as beans, whole grains, and fresh fruits and vegetables. Limit foods that are high in fat and processed sugars, such as fried or sweet foods. General instructions Do not douche, use tampons, or have sex for 2 weeks after the procedure, or until your health care provider approves. Do not take baths, swim, or use a hot tub until your health care provider approves. Take showers instead of baths for 2 weeks, or for as long as told by your health care provider. Keep all follow-up visits. This is important. Contact a health care provider if: You feel dizzy or lightheaded. You feel nauseous. You have abnormal vaginal discharge. You have a rash. You have pain that does not get better with medicine. You have chills. Get help right away if: You have bleeding that is heavier than a normal menstrual period. You have a fever. You have pain or cramps that get worse. You develop new abdominal pain. You faint. You have pain in your shoulder. You are short of breath. Summary Hysteroscopy is a procedure that is used to look inside a woman's womb (uterus). After the procedure, you may have bleeding, which varies from light spotting to menstrual-like bleeding. This is normal. You may also have cramps. Do not douche, use tampons, or have sex for 2 weeks after the procedure, or until your health care provider approves. Plan to have a responsible adult take you home  from the hospital or clinic. This information is not intended to replace advice given to you by your health care provider. Make sure you discuss any questions you have with your health care provider. Document Revised: 11/19/2020 Document Reviewed: 08/10/2019 Elsevier Patient Education  2023 [employer]. General Anesthesia, Adult, Care After This sheet gives you information about how to care for yourself after your procedure. Your health care provider may also give you more specific instructions. If you have problems or questions, contact your health care provider. What can I expect after the procedure? After the procedure, the following side effects are common: Pain or discomfort at the IV site. Nausea. Vomiting. Sore throat. Trouble concentrating. Feeling cold or chills. Feeling weak or tired. Sleepiness and fatigue. Soreness and body aches. These side effects can affect parts of the body that were not involved in surgery. Follow these instructions at home: For the time period you were told by your health care provider:  Rest. Do not participate in activities where you could fall or become injured. Do not drive or use machinery. Do not drink alcohol. Do not take sleeping pills or medicines that cause drowsiness. Do not make important decisions  or sign legal documents. Do not take care of children on your own. Eating and drinking Follow any instructions from your health care provider about eating or drinking restrictions. When you feel hungry, start by eating small amounts of foods that are soft and easy to digest (bland), such as toast. Gradually return to your regular diet. Drink enough fluid to keep your urine pale yellow. If you vomit, rehydrate by drinking water, juice, or clear broth. General instructions If you have sleep apnea, surgery and certain medicines can increase your risk for breathing problems. Follow instructions from your health care provider about wearing your  sleep device: Anytime you are sleeping, including during daytime naps. While taking prescription pain medicines, sleeping medicines, or medicines that make you drowsy. Have a responsible adult stay with you for the time you are told. It is important to have someone help care for you until you are awake and alert. Return to your normal activities as told by your health care provider. Ask your health care provider what activities are safe for you. Take over-the-counter and prescription medicines only as told by your health care provider. If you smoke, do not smoke without supervision. Keep all follow-up visits as told by your health care provider. This is important. Contact a health care provider if: You have nausea or vomiting that does not get better with medicine. You cannot eat or drink without vomiting. You have pain that does not get better with medicine. You are unable to pass urine. You develop a skin rash. You have a fever. You have redness around your IV site that gets worse. Get help right away if: You have difficulty breathing. You have chest pain. You have blood in your urine or stool, or you vomit blood. Summary After the procedure, it is common to have a sore throat or nausea. It is also common to feel tired. Have a responsible adult stay with you for the time you are told. It is important to have someone help care for you until you are awake and alert. When you feel hungry, start by eating small amounts of foods that are soft and easy to digest (bland), such as toast. Gradually return to your regular diet. Drink enough fluid to keep your urine pale yellow. Return to your normal activities as told by your health care provider. Ask your health care provider what activities are safe for you. This information is not intended to replace advice given to you by your health care provider. Make sure you discuss any questions you have with your health care provider. Document Revised:  09/08/2019 Document Reviewed: 04/07/2019 Elsevier Patient Education  2023 [employer].

## 2021-07-01 ENCOUNTER — Other Ambulatory Visit: Payer: Self-pay | Admitting: Obstetrics & Gynecology

## 2021-07-01 ENCOUNTER — Encounter (HOSPITAL_COMMUNITY): Payer: Self-pay

## 2021-07-01 ENCOUNTER — Encounter (HOSPITAL_COMMUNITY)
Admission: RE | Admit: 2021-07-01 | Discharge: 2021-07-01 | Disposition: A | Discharge status: Home / Self Care | Payer: Medicaid Other | Source: Ambulatory Visit | Attending: Obstetrics & Gynecology | Admitting: Obstetrics & Gynecology

## 2021-07-01 VITALS — BP 124/73 | HR 70 | Temp 98.4°F | Resp 18 | Ht 62.0 in | Wt 180.0 lb

## 2021-07-01 DIAGNOSIS — Z01818 Encounter for other preprocedural examination: Secondary | ICD-10-CM

## 2021-07-01 DIAGNOSIS — E119 Type 2 diabetes mellitus without complications: Secondary | ICD-10-CM | POA: Diagnosis not present

## 2021-07-01 DIAGNOSIS — Z01812 Encounter for preprocedural laboratory examination: Secondary | ICD-10-CM | POA: Diagnosis present

## 2021-07-01 LAB — URINALYSIS, ROUTINE W REFLEX MICROSCOPIC
Bilirubin Urine: NEGATIVE
Glucose, UA: NEGATIVE mg/dL
Hgb urine dipstick: NEGATIVE
Ketones, ur: NEGATIVE mg/dL
Leukocytes,Ua: NEGATIVE
Nitrite: NEGATIVE
Protein, ur: NEGATIVE mg/dL
Specific Gravity, Urine: 1.005 (ref 1.005–1.030)
pH: 7 (ref 5.0–8.0)

## 2021-07-01 LAB — COMPREHENSIVE METABOLIC PANEL
ALT: 13 U/L (ref 0–44)
AST: 14 U/L — ABNORMAL LOW (ref 15–41)
Albumin: 3.5 g/dL (ref 3.5–5.0)
Alkaline Phosphatase: 48 U/L (ref 38–126)
Anion gap: 5 (ref 5–15)
BUN: 11 mg/dL (ref 6–20)
CO2: 26 mmol/L (ref 22–32)
Calcium: 8.5 mg/dL — ABNORMAL LOW (ref 8.9–10.3)
Chloride: 106 mmol/L (ref 98–111)
Creatinine, Ser: 0.63 mg/dL (ref 0.44–1.00)
GFR, Estimated: >60 mL/min (ref >60)
Glucose, Bld: 104 mg/dL — ABNORMAL HIGH (ref 70–99)
Potassium: 3.7 mmol/L (ref 3.5–5.1)
Sodium: 137 mmol/L (ref 135–145)
Total Bilirubin: 0.3 mg/dL (ref 0.3–1.2)
Total Protein: 6.6 g/dL (ref 6.5–8.1)

## 2021-07-01 LAB — CBC WITH DIFFERENTIAL/PLATELET
Abs Immature Granulocytes: 0.01 10*3/uL (ref 0.00–0.07)
Basophils Absolute: 0 10*3/uL (ref 0.0–0.1)
Basophils Relative: 1 %
Eosinophils Absolute: 0.1 10*3/uL (ref 0.0–0.5)
Eosinophils Relative: 3 %
HCT: 33.5 % — ABNORMAL LOW (ref 36.0–46.0)
Hemoglobin: 10.2 g/dL — ABNORMAL LOW (ref 12.0–15.0)
Immature Granulocytes: 0 %
Lymphocytes Relative: 41 %
Lymphs Abs: 1.8 10*3/uL (ref 0.7–4.0)
MCH: 23.7 pg — ABNORMAL LOW (ref 26.0–34.0)
MCHC: 30.4 g/dL (ref 30.0–36.0)
MCV: 77.9 fL — ABNORMAL LOW (ref 80.0–100.0)
Monocytes Absolute: 0.3 10*3/uL (ref 0.1–1.0)
Monocytes Relative: 7 %
Neutro Abs: 2.2 10*3/uL (ref 1.7–7.7)
Neutrophils Relative %: 48 %
Platelets: 296 10*3/uL (ref 150–400)
RBC: 4.3 MIL/uL (ref 3.87–5.11)
RDW: 18.1 % — ABNORMAL HIGH (ref 11.5–15.5)
WBC: 4.4 10*3/uL (ref 4.0–10.5)
nRBC: 0 % (ref 0.0–0.2)

## 2021-07-01 LAB — RAPID HIV SCREEN (HIV 1/2 AB+AG)
HIV 1/2 Antibodies: NONREACTIVE
HIV-1 P24 Antigen - HIV24: NONREACTIVE

## 2021-07-03 ENCOUNTER — Other Ambulatory Visit: Payer: Self-pay

## 2021-07-03 ENCOUNTER — Ambulatory Visit (HOSPITAL_COMMUNITY)
Admission: RE | Admit: 2021-07-03 | Discharge: 2021-07-03 | Disposition: A | Discharge status: Home / Self Care | Payer: Medicaid Other | Source: Ambulatory Visit | Attending: Obstetrics & Gynecology | Admitting: Obstetrics & Gynecology

## 2021-07-03 ENCOUNTER — Ambulatory Visit (HOSPITAL_COMMUNITY): Payer: Medicaid Other | Admitting: Anesthesiology

## 2021-07-03 ENCOUNTER — Encounter (HOSPITAL_COMMUNITY)
Admission: RE | Disposition: A | Discharge status: Home / Self Care | Payer: Self-pay | Source: Ambulatory Visit | Attending: Obstetrics & Gynecology

## 2021-07-03 ENCOUNTER — Encounter (HOSPITAL_COMMUNITY): Payer: Self-pay | Admitting: Obstetrics & Gynecology

## 2021-07-03 ENCOUNTER — Ambulatory Visit (HOSPITAL_BASED_OUTPATIENT_CLINIC_OR_DEPARTMENT_OTHER): Payer: Medicaid Other | Admitting: Anesthesiology

## 2021-07-03 DIAGNOSIS — F419 Anxiety disorder, unspecified: Secondary | ICD-10-CM | POA: Insufficient documentation

## 2021-07-03 DIAGNOSIS — N840 Polyp of corpus uteri: Secondary | ICD-10-CM

## 2021-07-03 DIAGNOSIS — N938 Other specified abnormal uterine and vaginal bleeding: Secondary | ICD-10-CM | POA: Diagnosis not present

## 2021-07-03 DIAGNOSIS — N946 Dysmenorrhea, unspecified: Secondary | ICD-10-CM

## 2021-07-03 DIAGNOSIS — F32A Depression, unspecified: Secondary | ICD-10-CM | POA: Insufficient documentation

## 2021-07-03 HISTORY — PX: DILATATION AND CURETTAGE/HYSTEROSCOPY WITH MINERVA: SHX6851

## 2021-07-03 LAB — POCT PREGNANCY, URINE: Preg Test, Ur: NEGATIVE

## 2021-07-03 SURGERY — DILATATION AND CURETTAGE/HYSTEROSCOPY WITH MINERVA
Anesthesia: General | Site: Vagina

## 2021-07-03 MED ORDER — FENTANYL CITRATE (PF) 100 MCG/2ML IJ SOLN
INTRAMUSCULAR | Status: AC
  Filled 2021-07-03: qty 2

## 2021-07-03 MED ORDER — CEFAZOLIN SODIUM-DEXTROSE 2-4 GM/100ML-% IV SOLN
INTRAVENOUS | Status: AC
  Filled 2021-07-03: qty 100

## 2021-07-03 MED ORDER — CEFAZOLIN SODIUM-DEXTROSE 2-4 GM/100ML-% IV SOLN
2.0000 g | INTRAVENOUS | Status: AC
  Administered 2021-07-03: 2 g via INTRAVENOUS

## 2021-07-03 MED ORDER — 0.9 % SODIUM CHLORIDE (POUR BTL) OPTIME
TOPICAL | Status: DC | PRN
  Administered 2021-07-03: 1000 mL

## 2021-07-03 MED ORDER — PHENYLEPHRINE 80 MCG/ML (10ML) SYRINGE FOR IV PUSH (FOR BLOOD PRESSURE SUPPORT)
PREFILLED_SYRINGE | INTRAVENOUS | Status: AC
  Filled 2021-07-03: qty 10

## 2021-07-03 MED ORDER — ONDANSETRON HCL 4 MG/2ML IJ SOLN
INTRAMUSCULAR | Status: AC
  Filled 2021-07-03: qty 4

## 2021-07-03 MED ORDER — PROPOFOL 10 MG/ML IV BOLUS
INTRAVENOUS | Status: AC
  Filled 2021-07-03: qty 20

## 2021-07-03 MED ORDER — LACTATED RINGERS IV SOLN: INTRAVENOUS | Status: DC | PRN

## 2021-07-03 MED ORDER — LIDOCAINE HCL (CARDIAC) PF 50 MG/5ML IV SOSY
PREFILLED_SYRINGE | INTRAVENOUS | Status: DC | PRN
  Administered 2021-07-03: 100 mg via INTRAVENOUS

## 2021-07-03 MED ORDER — MIDAZOLAM HCL 2 MG/2ML IJ SOLN
INTRAMUSCULAR | Status: AC
  Filled 2021-07-03: qty 2

## 2021-07-03 MED ORDER — FENTANYL CITRATE PF 50 MCG/ML IJ SOSY: 25.0000 ug | PREFILLED_SYRINGE | INTRAMUSCULAR | Status: DC | PRN

## 2021-07-03 MED ORDER — KETOROLAC TROMETHAMINE 10 MG PO TABS: 10.0000 mg | ORAL_TABLET | Freq: Three times a day (TID) | ORAL | 0 refills | Status: AC | PRN

## 2021-07-03 MED ORDER — GLYCOPYRROLATE PF 0.2 MG/ML IJ SOSY
PREFILLED_SYRINGE | INTRAMUSCULAR | Status: AC
  Filled 2021-07-03: qty 2

## 2021-07-03 MED ORDER — SODIUM CHLORIDE 0.9 % IR SOLN
Status: DC | PRN
  Administered 2021-07-03: 3000 mL

## 2021-07-03 MED ORDER — SUCCINYLCHOLINE CHLORIDE 200 MG/10ML IV SOSY
PREFILLED_SYRINGE | INTRAVENOUS | Status: AC
  Filled 2021-07-03: qty 10

## 2021-07-03 MED ORDER — MIDAZOLAM HCL 5 MG/5ML IJ SOLN
INTRAMUSCULAR | Status: DC | PRN
  Administered 2021-07-03: 2 mg via INTRAVENOUS

## 2021-07-03 MED ORDER — PROPOFOL 10 MG/ML IV BOLUS
INTRAVENOUS | Status: DC | PRN
  Administered 2021-07-03 (×2): 50 mg via INTRAVENOUS
  Administered 2021-07-03: 150 mg via INTRAVENOUS

## 2021-07-03 MED ORDER — LIDOCAINE HCL (PF) 2 % IJ SOLN
INTRAMUSCULAR | Status: AC
  Filled 2021-07-03: qty 10

## 2021-07-03 MED ORDER — POVIDONE-IODINE 10 % EX SWAB: 2.0000 | Freq: Once | CUTANEOUS | Status: DC

## 2021-07-03 MED ORDER — FENTANYL CITRATE (PF) 100 MCG/2ML IJ SOLN
INTRAMUSCULAR | Status: DC | PRN
  Administered 2021-07-03 (×2): 50 ug via INTRAVENOUS

## 2021-07-03 MED ORDER — ROCURONIUM BROMIDE 10 MG/ML (PF) SYRINGE
PREFILLED_SYRINGE | INTRAVENOUS | Status: AC
  Filled 2021-07-03: qty 10

## 2021-07-03 MED ORDER — ONDANSETRON HCL 4 MG/2ML IJ SOLN: 4.0000 mg | Freq: Once | INTRAMUSCULAR | Status: DC | PRN

## 2021-07-03 MED ORDER — HYDROCODONE-ACETAMINOPHEN 5-325 MG PO TABS: 1.0000 | ORAL_TABLET | Freq: Four times a day (QID) | ORAL | 0 refills | Status: DC | PRN

## 2021-07-03 MED ORDER — KETOROLAC TROMETHAMINE 30 MG/ML IJ SOLN
INTRAMUSCULAR | Status: AC
  Administered 2021-07-03: 30 mg via INTRAVENOUS
  Filled 2021-07-03: qty 1

## 2021-07-03 MED ORDER — KETOROLAC TROMETHAMINE 30 MG/ML IJ SOLN: 30.0000 mg | Freq: Once | INTRAMUSCULAR | Status: AC

## 2021-07-03 MED ORDER — ONDANSETRON 8 MG PO TBDP: 8.0000 mg | ORAL_TABLET | Freq: Three times a day (TID) | ORAL | 0 refills | Status: AC | PRN

## 2021-07-03 SURGICAL SUPPLY — 31 items
BAG HAMPER (MISCELLANEOUS) ×2 IMPLANT
CLOTH BEACON ORANGE TIMEOUT ST (SAFETY) ×2 IMPLANT
COVER LIGHT HANDLE STERIS (MISCELLANEOUS) ×4 IMPLANT
GAUZE 4X4 16PLY ~~LOC~~+RFID DBL (SPONGE) ×4 IMPLANT
GLOVE BIOGEL PI IND STRL 7.0 (GLOVE) ×2 IMPLANT
GLOVE BIOGEL PI IND STRL 8 (GLOVE) ×1 IMPLANT
GLOVE BIOGEL PI INDICATOR 7.0 (GLOVE) ×3
GLOVE BIOGEL PI INDICATOR 8 (GLOVE) ×1
GLOVE ECLIPSE 8.0 STRL XLNG CF (GLOVE) ×4 IMPLANT
GLOVE SRG 8 PF TXTR STRL LF DI (GLOVE) ×1 IMPLANT
GLOVE SURG UNDER POLY LF SZ8 (GLOVE) ×2
GOWN STRL REUS W/TWL LRG LVL3 (GOWN DISPOSABLE) ×3 IMPLANT
GOWN STRL REUS W/TWL XL LVL3 (GOWN DISPOSABLE) ×2 IMPLANT
HANDPIECE ABLA MINERVA ENDO (MISCELLANEOUS) ×2 IMPLANT
INST SET HYSTEROSCOPY (KITS) ×2 IMPLANT
IV NS 1000ML (IV SOLUTION) ×2
IV NS 1000ML BAXH (IV SOLUTION) ×1 IMPLANT
KIT TURNOVER CYSTO (KITS) ×2 IMPLANT
MANIFOLD NEPTUNE II (INSTRUMENTS) ×2 IMPLANT
MARKER SKIN DUAL TIP RULER LAB (MISCELLANEOUS) ×2 IMPLANT
NS IRRIG 1000ML POUR BTL (IV SOLUTION) ×2 IMPLANT
PACK BASIC III (CUSTOM PROCEDURE TRAY) ×2
PACK SRG BSC III STRL LF ECLPS (CUSTOM PROCEDURE TRAY) ×1 IMPLANT
PAD ARMBOARD 7.5X6 YLW CONV (MISCELLANEOUS) ×2 IMPLANT
PAD TELFA 3X4 1S STER (GAUZE/BANDAGES/DRESSINGS) ×2 IMPLANT
SET BASIN LINEN APH (SET/KITS/TRAYS/PACK) ×2 IMPLANT
SET CYSTO W/LG BORE CLAMP LF (SET/KITS/TRAYS/PACK) ×2 IMPLANT
SHEET LAVH (DRAPES) ×2 IMPLANT
SUT VIC AB 0 CT1 27 (SUTURE) ×2
SUT VIC AB 0 CT1 27XBRD ANTBC (SUTURE) IMPLANT
TUBE CONNECTING 12X1/4 (SUCTIONS) ×2 IMPLANT

## 2021-07-03 NOTE — Transfer of Care (Signed)
Immediate Anesthesia Transfer of Care Note  Patient: [name] Velazquez  Procedure(s) Performed: DILATATION AND CURETTAGE/HYSTEROSCOPY WITH MINERVA (Vagina )  Patient Location: PACU  Anesthesia Type:General  Level of Consciousness: alert   Airway & Oxygen Therapy: Patient Spontanous Breathing  Post-op Assessment: Report given to RN  Post vital signs: Reviewed and stable  Last Vitals:  Vitals Value Taken Time  BP 126/72 07/03/21 1541  Temp    Pulse 78 07/03/21 1542  Resp 13 07/03/21 1542  SpO2 99 % 07/03/21 1542  Vitals shown include unvalidated device data.  Last Pain:  Vitals:   07/03/21 1313  TempSrc: Oral  PainSc: 0-No pain      Patients Stated Pain Goal: 7 (07/03/21 1313)  Complications: No notable events documented.

## 2021-07-03 NOTE — Anesthesia Preprocedure Evaluation (Signed)
Anesthesia Evaluation  Patient identified by MRN, date of birth, ID band Patient awake    Reviewed: Allergy & Precautions, H&P , NPO status , Patient's Chart, lab work & pertinent test results, reviewed documented beta blocker date and time   Airway Mallampati: II  TM Distance: >3 FB Neck ROM: full    Dental no notable dental hx.    Pulmonary neg pulmonary ROS,    Pulmonary exam normal breath sounds clear to auscultation       Cardiovascular Exercise Tolerance: Good negative cardio ROS   Rhythm:regular Rate:Normal     Neuro/Psych PSYCHIATRIC DISORDERS Anxiety Depression negative neurological ROS     GI/Hepatic negative GI ROS, Neg liver ROS,   Endo/Other  negative endocrine ROS  Renal/GU negative Renal ROS  negative genitourinary   Musculoskeletal   Abdominal   Peds  Hematology negative hematology ROS (+)   Anesthesia Other Findings   Reproductive/Obstetrics negative OB ROS                             Anesthesia Physical Anesthesia Plan  ASA: 2  Anesthesia Plan: General and General LMA   Post-op Pain Management:    Induction:   PONV Risk Score and Plan: Ondansetron  Airway Management Planned:   Additional Equipment:   Intra-op Plan:   Post-operative Plan:   Informed Consent: I have reviewed the patients History and Physical, chart, labs and discussed the procedure including the risks, benefits and alternatives for the proposed anesthesia with the patient or authorized representative who has indicated his/her understanding and acceptance.     Dental Advisory Given  Plan Discussed with: CRNA  Anesthesia Plan Comments:         Anesthesia Quick Evaluation

## 2021-07-03 NOTE — Anesthesia Procedure Notes (Signed)
Procedure Name: LMA Insertion Date/Time: 07/03/2021 2:49 PM  Performed by: Moshe Salisbury, CRNAPre-anesthesia Checklist: Patient identified, Patient being monitored, Emergency Drugs available, Timeout performed and Suction available Patient Re-evaluated:Patient Re-evaluated prior to induction Oxygen Delivery Method: Circle System Utilized Preoxygenation: Pre-oxygenation with 100% oxygen Induction Type: IV induction Ventilation: Mask ventilation without difficulty LMA: LMA inserted LMA Size: 4.0 Number of attempts: 1 Placement Confirmation: positive ETCO2 and breath sounds checked- equal and bilateral

## 2021-07-03 NOTE — Op Note (Signed)
Preoperative diagnosis:  1.   Endometrial polyps x 3                                         2.  dysmenorrhea   Postoperative diagnoses: Same as above   Procedure: Hysteroscopy, uterine curettage, removal of  polyps endometrial ablation using Minerva  Surgeon: Lazaro Arms   Anesthesia: Laryngeal mask airway  Findings: The endometrium was normal. There were no fibroid or other abnormalities.  Description of operation: The patient was taken to the operating room and placed in the supine position. She underwent general anesthesia using the laryngeal mask airway. She was placed in the dorsal lithotomy position and prepped and draped in the usual sterile fashion. A Graves speculum was placed and the anterior cervical lip was grasped with a single-tooth tenaculum. The cervix was dilated serially to allow passage of the hysteroscope. Diagnostic hysteroscopy was performed and was found to be normal. A vigorous uterine curettage was then performed and all tissue sent to pathology for evaluation.  I then proceeded to perform the Minerva endometrial ablation.   The uterus sounded to 9.5 cm The handpiece was attached to the Minerva power source/machine and the handpiece passed the checklist. The array was squeezed down to remove all of the air present.  The array was then place into the endometrial cavity and deployed to a length of 6.5 cm. The handpiece confirmed appropriate width by being in the green portion of the visual dial. The cervical cuff was then inflated to the point the CO2 indicator was in the green. The endometrial integrity check was then performed and integrity sequence was confirmed x 2. The heating was then begun and carried out for a total of 2 minutes(which is standard therapy time). When the plasma cycle was finished,  the cervical cuff was deflated and the array was removed with tissue present on the silicon membrane. There was appropriate post Minerva bleeding and uterine  discharge.     All of the equipment worked well throughout the procedure.  The patient was awakened from anesthesia and taken to the recovery room in good stable condition all counts were correct. She received 2 g of Ancef and 30 mg of Toradol preoperatively. She will be discharged from the recovery room and followed up in the office in 1- 2 weeks.   She can expect 4 weeks of post procedure bloody watery discharge  Lazaro Arms, MD  07/03/2021 3:30 PM

## 2021-07-03 NOTE — H&P (Signed)
Preoperative History and Physical  [name] Velazquez is a 46 y.o. [id number] with Patient's last menstrual period was 05/25/2021 (exact date). admitted for a hysteroscopy uterine curettage MInerva ablation for endometrial polyps and dysmenorrhea.    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)    Stress incontinence in female    Vitamin B12 deficiency    Vitamin D deficiency     PSH:     Past Surgical History:  Procedure Laterality Date   APPENDECTOMY     CESAREAN SECTION     CHOLECYSTECTOMY     DERMOID CYST  EXCISION     INTESTINAL MALROTATION REPAIR     lads     PUBOVAGINAL SLING N/A 08/24/2014   Procedure: Leonides Grills;  Surgeon: Vena Austria, MD;  Location: [facility] ORS;  Service: Gynecology;  Laterality: N/A;    POb/GynH:      OB History     Gravida  5   Para  4   Term  4   Preterm      AB      Living  5      SAB      IAB      Ectopic      Multiple      Live Births  4           SH:   Social History   Tobacco Use   Smoking status: Never   Smokeless tobacco: Never  Vaping Use   Vaping Use: Never used  Substance Use Topics   Alcohol use: No    Alcohol/week: 0.0 standard drinks of alcohol   Drug use: No    FH:    Family History  Problem Relation Age of Onset   Breast cancer Maternal Grandmother 55   Multiple sclerosis Father        died at age 89   Hypertension Mother        died at age 28   Obesity Mother    Heart disease Mother    Multiple sclerosis Sister      Allergies: No Known Allergies  Medications:       Current Facility-Administered Medications:    ceFAZolin (ANCEF) 2-4 GM/100ML-% IVPB, , , ,    [START ON  07/04/2021] ceFAZolin (ANCEF) IVPB 2g/100 mL premix, 2 g, Intravenous, On Call to OR, Lazaro Arms, MD   ketorolac (TORADOL) 30 MG/ML injection 30 mg, 30 mg, Intravenous, Once, Lazaro Arms, MD   ketorolac (TORADOL) 30 MG/ML injection, , , ,    povidone-iodine 10 % swab 2 Application, 2 Application, Topical, Once, Lazaro Arms, MD  Review of Systems:   Review of Systems  Constitutional: Negative for fever, chills, weight loss, malaise/fatigue and diaphoresis.  HENT: Negative for hearing loss, ear pain, nosebleeds, congestion, sore throat, neck pain, tinnitus and ear discharge.   Eyes: Negative for blurred vision, double vision, photophobia, pain, discharge and redness.  Respiratory: Negative for cough, hemoptysis, sputum production, shortness of breath, wheezing and stridor.   Cardiovascular: Negative for chest pain, palpitations, orthopnea, claudication, leg swelling and PND.  Gastrointestinal:  Positive for abdominal pain. Negative for heartburn, nausea, vomiting, diarrhea, constipation, blood in stool and melena.  Genitourinary: Negative for dysuria, urgency, frequency, hematuria and flank pain.  Musculoskeletal: Negative for myalgias, back pain, joint pain and falls.  Skin: Negative for itching and rash.  Neurological: Negative for dizziness, tingling, tremors, sensory change, speech change, focal weakness, seizures, loss of consciousness, weakness and headaches.  Endo/Heme/Allergies: Negative for environmental allergies and polydipsia. Does not bruise/bleed easily.  Psychiatric/Behavioral: Negative for depression, suicidal ideas, hallucinations, memory loss and substance abuse. The patient is not nervous/anxious and does not have insomnia.      PHYSICAL EXAM:  Blood pressure 132/86, pulse 70, temperature 98.6 F (37 C), temperature source Oral, resp. rate 16, height 5\' 2"  (1.575 m), weight 81.6 kg, last menstrual period 05/25/2021, SpO2 98 %.    Vitals reviewed. Constitutional:  She is oriented to person, place, and time. She appears well-developed and well-nourished.  HENT:  Head: Normocephalic and atraumatic.  Right Ear: External ear normal.  Left Ear: External ear normal.  Nose: Nose normal.  Mouth/Throat: Oropharynx is clear and moist.  Eyes: Conjunctivae and EOM are normal. Pupils are equal, round, and reactive to light. Right eye exhibits no discharge. Left eye exhibits no discharge. No scleral icterus.  Neck: Normal range of motion. Neck supple. No tracheal deviation present. No thyromegaly present.  Cardiovascular: Normal rate, regular rhythm, normal heart sounds and intact distal pulses.  Exam reveals no gallop and no friction rub.   No murmur heard. Respiratory: Effort normal and breath sounds normal. No respiratory distress. She has no wheezes. She has no rales. She exhibits no tenderness.  GI: Soft. Bowel sounds are normal. She exhibits no distension and no mass. There is tenderness. There is no rebound and no guarding.  Genitourinary:       Vulva is normal without lesions Vagina is pink moist without discharge Cervix normal in appearance and pap is normal Uterus is normal size, contour, position, consistency, mobility, non-tender Adnexa is negative with normal sized ovaries by sonogram  Musculoskeletal: Normal range of motion. She exhibits no edema and no tenderness.  Neurological: She is alert and oriented to person, place, and time. She has normal reflexes. She displays normal reflexes. No cranial nerve deficit. She exhibits normal muscle tone. Coordination normal.  Skin: Skin is warm and dry. No rash noted. No erythema. No pallor.  Psychiatric: She has a normal mood and affect. Her behavior is normal. Judgment and thought content normal.    Labs: Results for orders placed or performed during the hospital encounter of 07/01/21 (from the past 336 hour(s))  CBC with Differential/Platelet   Collection Time: 07/01/21  2:25 PM  Result Value Ref Range    WBC 4.4 4.0 - 10.5 K/uL   RBC 4.30 3.87 - 5.11 MIL/uL   Hemoglobin 10.2 (L) 12.0 - 15.0 g/dL   HCT 07/03/21 (L) 45.3 - 64.6 %   MCV 77.9 (L) 80.0 - 100.0 fL   MCH 23.7 (L) 26.0 - 34.0 pg   MCHC 30.4 30.0 - 36.0 g/dL   RDW 80.3 (H) 21.2 - 24.8 %   Platelets 296 150 - 400 K/uL   nRBC 0.0 0.0 - 0.2 %   Neutrophils Relative % 48 %   Neutro Abs 2.2 1.7 - 7.7 K/uL   Lymphocytes Relative 41 %   Lymphs Abs 1.8 0.7 - 4.0 K/uL   Monocytes Relative 7 %   Monocytes Absolute 0.3 0.1 - 1.0 K/uL   Eosinophils Relative  3 %   Eosinophils Absolute 0.1 0.0 - 0.5 K/uL   Basophils Relative 1 %   Basophils Absolute 0.0 0.0 - 0.1 K/uL   Immature Granulocytes 0 %   Abs Immature Granulocytes 0.01 0.00 - 0.07 K/uL  Comprehensive metabolic panel   Collection Time: 07/01/21  2:25 PM  Result Value Ref Range   Sodium 137 135 - 145 mmol/L   Potassium 3.7 3.5 - 5.1 mmol/L   Chloride 106 98 - 111 mmol/L   CO2 26 22 - 32 mmol/L   Glucose, Bld 104 (H) 70 - 99 mg/dL   BUN 11 6 - 20 mg/dL   Creatinine, Ser 9.79 0.44 - 1.00 mg/dL   Calcium 8.5 (L) 8.9 - 10.3 mg/dL   Total Protein 6.6 6.5 - 8.1 g/dL   Albumin 3.5 3.5 - 5.0 g/dL   AST 14 (L) 15 - 41 U/L   ALT 13 0 - 44 U/L   Alkaline Phosphatase 48 38 - 126 U/L   Total Bilirubin 0.3 0.3 - 1.2 mg/dL   GFR, Estimated >89 >21 mL/min   Anion gap 5 5 - 15  Rapid HIV screen (HIV 1/2 Ab+Ag)   Collection Time: 07/01/21  2:25 PM  Result Value Ref Range   HIV-1 P24 Antigen - HIV24 NON REACTIVE NON REACTIVE   HIV 1/2 Antibodies NON REACTIVE NON REACTIVE   Interpretation (HIV Ag Ab)      A non reactive test result means that HIV 1 or HIV 2 antibodies and HIV 1 p24 antigen were not detected in the specimen.  Urinalysis, Routine w reflex microscopic Urine, Clean Catch   Collection Time: 07/01/21  2:25 PM  Result Value Ref Range   Color, Urine STRAW (A) YELLOW   APPearance CLEAR CLEAR   Specific Gravity, Urine 1.005 1.005 - 1.030   pH 7.0 5.0 - 8.0   Glucose, UA  NEGATIVE NEGATIVE mg/dL   Hgb urine dipstick NEGATIVE NEGATIVE   Bilirubin Urine NEGATIVE NEGATIVE   Ketones, ur NEGATIVE NEGATIVE mg/dL   Protein, ur NEGATIVE NEGATIVE mg/dL   Nitrite NEGATIVE NEGATIVE   Leukocytes,Ua NEGATIVE NEGATIVE    EKG: Orders placed or performed during the hospital encounter of 01/04/18   EKG 12-Lead   EKG 12-Lead   EKG 12-Lead   EKG 12-Lead    Imaging Studies: US PELVIS (TRANSABDOMINAL ONLY)  Result Date: 06/05/2021 Images from the original result were not included.  ..an Financial trader of Ultrasound Medicine Technical sales engineer) accredited practice Center for [facility] [address] [place] [address] Ordering Provider: Cheral Marker, CNM                                                                                                   GYNECOLOGIC SONOGRAM [name] Velazquez is a 46 y.o. [id number] LMP 05/25/2021 She is here for a pelvic sonogram for menorrhagia,dyspareunia,LLQ pain. Uterus                      7.8 x 6.4 x 7.3 cm, Total uterine volume 191 cc: Heterogeneous anteverted uterus  with linear striations,anterior fundal right intramural fibroid 1 x 1 x 1.6 cm Endometrium          13.4 mm, symmetrical, three echogenic masses within the endometrium with color flow (#1) 1.9 x 1.8  x .9 cm,(#2) 1.3 x 1.6 x .8 cm (#3) 1.8 x .8 x 1.5 cm Right ovary             3.1 x 1.8 x 2.5 cm, normal Left ovary                3.2 x 1.8 x 3.5 cm, normal No free fluid Technician Comments: PELVIC US TA/TV: Heterogeneous anteverted uterus with linear striations,anterior fundal right intramural fibroid 1 x 1 x 1.6 cm,EEC 13.4 mm,three echogenic masses within the endometrium with color flow (#1) 1.9 x 1.8  x .9 cm,(#2) 1.3 x 1.6 x .8 cm (#3) 1.8 x .8 x 1.5 cm,normal ovaries,ovaries appear mobile,no pain during ultrasound,no free fluid Chaperone [address] Flora Lipps 06/05/2021 3:14 PM Clinical Impression and recommendations: I have reviewed the sonogram results above,  combined with the patient's current clinical course, below are my impressions and any appropriate recommendations for management based on the sonographic findings. Uterus normal size shape and contour with small clinically insignificant fibroid, suggestion of minimal adenomyosis Endometrium 3 endometrial polyps are seen Ovaries: both ovaries are normal size shape and morphology Lazaro Arms 06/05/2021 3:37 PM  US PELVIS TRANSVAGINAL NON-OB (TV ONLY)  Result Date: 06/05/2021 Images from the original result were not included.  ..an Financial trader of Ultrasound Medicine Technical sales engineer) accredited practice Center for [facility] [address] [place] [address] Ordering Provider: Cheral Marker, CNM                                                                                                   GYNECOLOGIC SONOGRAM [name] Velazquez is a 46 y.o. [id number] LMP 05/25/2021 She is here for a pelvic sonogram for menorrhagia,dyspareunia,LLQ pain. Uterus                      7.8 x 6.4 x 7.3 cm, Total uterine volume 191 cc: Heterogeneous anteverted uterus with linear striations,anterior fundal right intramural fibroid 1 x 1 x 1.6 cm Endometrium          13.4 mm, symmetrical, three echogenic masses within the endometrium with color flow (#1) 1.9 x 1.8  x .9 cm,(#2) 1.3 x 1.6 x .8 cm (#3) 1.8 x .8 x 1.5 cm Right ovary             3.1 x 1.8 x 2.5 cm, normal Left ovary                3.2 x 1.8 x 3.5 cm, normal No free fluid Technician Comments: PELVIC US TA/TV: Heterogeneous anteverted uterus with linear striations,anterior fundal right intramural fibroid 1 x 1 x 1.6 cm,EEC 13.4 mm,three echogenic masses within the endometrium with color flow (#1) 1.9 x 1.8  x .9 cm,(#2) 1.3 x 1.6 x .8 cm (#3) 1.8 x .8 x  1.5 cm,normal ovaries,ovaries appear mobile,no pain during ultrasound,no free fluid Chaperone [address]Peggy Amber Flora LippsJ Carl 06/05/2021 3:14 PM Clinical Impression and recommendations: I have reviewed the sonogram  results above, combined with the patient's current clinical course, below are my impressions and any appropriate recommendations for management based on the sonographic findings. Uterus normal size shape and contour with small clinically insignificant fibroid, suggestion of minimal adenomyosis Endometrium 3 endometrial polyps are seen Ovaries: both ovaries are normal size shape and morphology Lazaro ArmsLuther H Dabney Dever 06/05/2021 3:37 PM     Assessment:  Endometrial polyps, 3 Dysmenorrhea   Plan: Hysteroscopy uterine curettage with minerva endometriaL ABLATION  Lazaro ArmsLuther H Zakhari Fogel 07/03/2021 1:53 PM

## 2021-07-04 ENCOUNTER — Encounter: Payer: Medicaid Other | Admitting: Obstetrics & Gynecology

## 2021-07-05 ENCOUNTER — Encounter (HOSPITAL_COMMUNITY): Payer: Self-pay | Admitting: Obstetrics & Gynecology

## 2021-07-05 LAB — SURGICAL PATHOLOGY

## 2021-07-06 NOTE — Anesthesia Postprocedure Evaluation (Signed)
Anesthesia Post Note  Patient: [name]  Procedure(s) Performed: DILATATION AND CURETTAGE/HYSTEROSCOPY WITH MINERVA (Vagina )  Patient location during evaluation: Phase II Anesthesia Type: General Level of consciousness: awake Pain management: pain level controlled Vital Signs Assessment: post-procedure vital signs reviewed and stable Respiratory status: spontaneous breathing and respiratory function stable Cardiovascular status: blood pressure returned to baseline and stable Postop Assessment: no headache and no apparent nausea or vomiting Anesthetic complications: no Comments: Late entry   No notable events documented.   Last Vitals:  Vitals:   07/03/21 1555 07/03/21 1601  BP: 140/77 139/82  Pulse: 81 78  Resp: 16 18  Temp:  36.7 C  SpO2: 100% 100%    Last Pain:  Vitals:   07/03/21 1601  TempSrc: Oral  PainSc: 0-No pain                 [name]

## 2021-07-11 ENCOUNTER — Encounter: Payer: Self-pay | Admitting: Obstetrics & Gynecology

## 2021-07-11 ENCOUNTER — Ambulatory Visit (INDEPENDENT_AMBULATORY_CARE_PROVIDER_SITE_OTHER): Payer: Medicaid Other | Admitting: Obstetrics & Gynecology

## 2021-07-11 VITALS — BP 136/78 | HR 71 | Ht 62.0 in | Wt 184.0 lb

## 2021-07-11 DIAGNOSIS — Z9889 Other specified postprocedural states: Secondary | ICD-10-CM

## 2021-08-22 ENCOUNTER — Encounter: Payer: Self-pay | Admitting: Obstetrics & Gynecology

## 2021-08-22 NOTE — Progress Notes (Signed)
  HPI: Patient returns for routine postoperative follow-up having undergone Hysteroscopy uterine curettage removal of polyps + endometrial ablation on 07/03/21.  The patient's immediate postoperative recovery has been unremarkable. Since hospital discharge the patient reports no problems.   Current Outpatient Medications: ferrous sulfate 325 (65 FE) MG tablet, Take 325 mg by mouth in the morning and at bedtime., Disp: , Rfl:  ketorolac (TORADOL) 10 MG tablet, Take 1 tablet (10 mg total) by mouth every 8 (eight) hours as needed. (Patient not taking: Reported on 07/11/2021), Disp: 15 tablet, Rfl: 0 ondansetron (ZOFRAN-ODT) 8 MG disintegrating tablet, Take 1 tablet (8 mg total) by mouth every 8 (eight) hours as needed for nausea or vomiting. (Patient not taking: Reported on 07/11/2021), Disp: 8 tablet, Rfl: 0 solifenacin (VESICARE) 10 MG tablet, Take 1 tablet (10 mg total) by mouth daily. (Patient not taking: Reported on 07/11/2021), Disp: 30 tablet, Rfl: 11  No current facility-administered medications for this visit.    Blood pressure 136/78, pulse 71, height 5\' 2"  (1.575 m), weight 184 lb (83.5 kg), last menstrual period 05/25/2021.  Physical Exam: Normal post ablation exam  Diagnostic Tests:   Pathology: benign  Impression + Management plan:   ICD-10-CM   1. Post-operative state  Z98.890          Medications Prescribed this encounter: No orders of the defined types were placed in this encounter.     Follow up: prn   05/27/2021, MD Attending Physician for the Center for [facility] and [facility] 08/22/2021 4:49 PM

## 2023-02-16 ENCOUNTER — Other Ambulatory Visit (HOSPITAL_COMMUNITY): Payer: Self-pay | Admitting: Otolaryngology

## 2023-02-16 DIAGNOSIS — E041 Nontoxic single thyroid nodule: Secondary | ICD-10-CM

## 2023-03-02 ENCOUNTER — Other Ambulatory Visit (HOSPITAL_COMMUNITY): Payer: Self-pay | Admitting: Otolaryngology

## 2023-03-02 DIAGNOSIS — E041 Nontoxic single thyroid nodule: Secondary | ICD-10-CM

## 2023-03-06 ENCOUNTER — Ambulatory Visit (HOSPITAL_COMMUNITY)
Admission: RE | Admit: 2023-03-06 | Discharge: 2023-03-06 | Disposition: A | Discharge status: Home / Self Care | Payer: Medicaid Other | Source: Ambulatory Visit | Attending: Otolaryngology | Admitting: Otolaryngology

## 2023-03-06 DIAGNOSIS — E041 Nontoxic single thyroid nodule: Secondary | ICD-10-CM | POA: Diagnosis present

## 2023-04-03 ENCOUNTER — Ambulatory Visit (HOSPITAL_COMMUNITY)
Admission: RE | Admit: 2023-04-03 | Discharge: 2023-04-03 | Disposition: A | Discharge status: Home / Self Care | Source: Ambulatory Visit | Attending: Otolaryngology | Admitting: Otolaryngology

## 2023-04-03 ENCOUNTER — Encounter (HOSPITAL_COMMUNITY): Payer: Self-pay

## 2023-04-03 DIAGNOSIS — E041 Nontoxic single thyroid nodule: Secondary | ICD-10-CM | POA: Insufficient documentation

## 2023-04-03 MED ORDER — LIDOCAINE HCL (PF) 2 % IJ SOLN
10.0000 mL | Freq: Once | INTRAMUSCULAR | Status: AC
  Administered 2023-04-03: 10 mL

## 2023-04-03 NOTE — Progress Notes (Signed)
 PT tolerated thyroid biopsy procedure well today. Labs and afirma obtained and sent for pathology by Richard from ultrasound. PT ambulatory at discharge with no acute distress noted and verbalized understanding of discharge instructions.

## 2023-04-08 LAB — CYTOLOGY - NON PAP

## 2023-04-20 ENCOUNTER — Institutional Professional Consult (permissible substitution) (INDEPENDENT_AMBULATORY_CARE_PROVIDER_SITE_OTHER): Payer: Medicaid Other

## 2023-04-20 NOTE — Progress Notes (Deleted)
 Dear Dr. Noralyn Pick, Here is my assessment for our mutual patient, [name] Velazquez. Thank you for allowing me the opportunity to care for your patient. Please do not hesitate to contact me should you have any other questions. Sincerely, Dr. Jovita Kussmaul  Otolaryngology Clinic Note Referring provider: Dr. Noralyn Pick HPI:  [name] Velazquez is a 48 y.o. female kindly referred by Dr. Noralyn Pick for evaluation of ***.   Goiter:  First noted: ***  Enlarging: *** Patient reports: *** Patient denies: *** Compressive symptoms: *** Hypo or hyperthyroid symptoms: *** Tobacco: ***  Prior evaluation has included: labs, [place], Biopsy ***  History of radiation to H&N: *** Family history of thyroid cancer: ***   H&N Surgery: *** Personal or FHx of bleeding dz or anesthesia difficulty: no ***  GLP-1: *** AP/AC: ***  Tobacco: ***. Alcohol: ***. Occupation: ***. Lives in *** with ***.  PMHx: prior provoked DVT, Parasthesias  Independent Review of Additional Tests or Records:  Dr. Ernestene Kiel (02/2023): incidentally noted thyroid nodule approx 4cm on scans in Jan 2024. No neck mass, otherwise no symptoms; Dx: Left thyroid nodule; Rx: FNA CBC and CMP 07/01/2021: WBC 4.4, Hgb 10.2, Plt 296; CMP BUN /Cr 11/0.63 FNA 04/03/2023 left thyroid nodule: [facility] II US Thyroid 03/06/2023 independently reviewed: agree with read, right superior thyroid nodule (<1cm), 1cm left superior thyroid nodule, Left dominant nodule 4.7x2.4x3 cm, appears well circumscribed CTA Neck (01/08/2023): unable to review images; reviewed report:  PMH/Meds/All/SocHx/FamHx/ROS:   Past Medical History:  Diagnosis Date   Anxiety    Bladder leak    Chronic diastolic heart failure (HCC)    Depression    Depression    Hyperlipemia    Lateral epicondylitis    Lumbago    Mitral regurgitation    Numbness and tingling    Numbness and tingling    Phlebitis or thrombophlebitis of deep vessel of lower extremity (HCC)    Stress incontinence in  female    Vitamin B12 deficiency    Vitamin D deficiency      Past Surgical History:  Procedure Laterality Date   APPENDECTOMY     CESAREAN SECTION     CHOLECYSTECTOMY     DERMOID CYST  EXCISION     DILATATION AND CURETTAGE/HYSTEROSCOPY WITH MINERVA N/A 07/03/2021   Procedure: DILATATION AND CURETTAGE/HYSTEROSCOPY WITH MINERVA;  Surgeon: Lazaro Arms, MD;  Location: [facility];  Service: Gynecology;  Laterality: N/A;   INTESTINAL MALROTATION REPAIR     lads     PUBOVAGINAL SLING N/A 08/24/2014   Procedure: Leonides Grills;  Surgeon: Vena Austria, MD;  Location: [facility];  Service: Gynecology;  Laterality: N/A;    Family History  Problem Relation Age of Onset   Breast cancer Maternal Grandmother 83   Multiple sclerosis Father        died at age 3   Hypertension Mother        died at age 2   Obesity Mother    Heart disease Mother    Multiple sclerosis Sister      Social Connections: Socially Integrated (05/06/2021)   Social Connection and Isolation Panel [NHANES]    Frequency of Communication with Friends and Family: More than three times a week    Frequency of Social Gatherings with Friends and Family: Once a week    Attends Religious Services: More than 4 times per year    Active Member of [employer] or Organizations: Yes    Attends Banker Meetings: More than 4 times per year  Marital Status: Living with partner      Current Outpatient Medications:    ferrous sulfate 325 (65 FE) MG tablet, Take 325 mg by mouth in the morning and at bedtime., Disp: , Rfl:    ketorolac (TORADOL) 10 MG tablet, Take 1 tablet (10 mg total) by mouth every 8 (eight) hours as needed. (Patient not taking: Reported on 07/11/2021), Disp: 15 tablet, Rfl: 0   ondansetron (ZOFRAN-ODT) 8 MG disintegrating tablet, Take 1 tablet (8 mg total) by mouth every 8 (eight) hours as needed for nausea or vomiting. (Patient not taking: Reported on 07/11/2021), Disp: 8 tablet, Rfl: 0   solifenacin  (VESICARE) 10 MG tablet, Take 1 tablet (10 mg total) by mouth daily. (Patient not taking: Reported on 07/11/2021), Disp: 30 tablet, Rfl: 11   Physical Exam:   There were no vitals taken for this visit.  Salient findings:  CN II-XII intact *** Bilateral EAC clear and TM intact with well pneumatized middle ear spaces Weber 512: *** Rinne 512: AC > BC b/l *** Rine 1024: AC > BC b/l *** Anterior rhinoscopy: Septum ***; bilateral inferior turbinates with *** No lesions of oral cavity/oropharynx; dentition *** No obviously palpable neck masses/lymphadenopathy/thyromegaly No respiratory distress or stridor***  Seprately Identifiable Procedures:  Prior to initiating any procedures, risks/benefits/alternatives were explained to the patient and verbal consent obtained. None***  Impression & Plans:  [name] Velazquez is a 48 y.o. female with ***  No diagnosis found.   - f/u ***  See below regarding exact medications prescribed this encounter including dosages and route: No orders of the defined types were placed in this encounter.     Thank you for allowing me the opportunity to care for your patient. Please do not hesitate to contact me should you have any other questions.  Sincerely, Milon Aloe, MD Otolaryngologist (ENT), [facility] Phone: [phone] Fax: [phone]  04/20/2023, 7:55 AM   MDM:  Level *** Complexity/Problems addressed: *** Data complexity: *** independent review of *** - Morbidity: ***  - Prescription Drug prescribed or managed: ***

## 2023-10-14 ENCOUNTER — Encounter: Payer: Self-pay | Admitting: Neurology

## 2023-11-04 ENCOUNTER — Other Ambulatory Visit: Payer: Self-pay

## 2023-11-04 DIAGNOSIS — R202 Paresthesia of skin: Secondary | ICD-10-CM

## 2023-12-28 ENCOUNTER — Ambulatory Visit: Admitting: Neurology

## 2023-12-28 DIAGNOSIS — G5603 Carpal tunnel syndrome, bilateral upper limbs: Secondary | ICD-10-CM

## 2023-12-28 DIAGNOSIS — R202 Paresthesia of skin: Secondary | ICD-10-CM | POA: Diagnosis not present

## 2023-12-28 NOTE — Procedures (Signed)
 " [facility]  [address], [address]  [place], [place] [address] Tel: [phone] Fax: [phone] Test Date:  12/28/2023  Patient: [name] Velazquez DOB: 1975-01-25 Physician: Venetia Potters, MD  Sex: Female Height: 5' 2 Ref Phys: Heinz Jacobs, FNP  ID#: [id number] Temp: 35.5C Technician:    History: This is a 48 year old female with bilateral hand numbness and tingling.  NCV & EMG Findings: Extensive electrodiagnostic evaluation of bilateral upper limbs shows: Right median sensory response is absent. Left median sensory response shows prolonged distal peak latency (4.1 ms). Bilateral ulnar and radial sensory responses are within normal limits. Bilateral median (APB) motor responses show prolonged distal onset latency (L4.2, R6.4 ms). Bilateral ulnar (ADM) motor responses are within normal limits. There is no evidence of active or chronic motor axon loss changes affecting any of the tested muscles on needle examination. Motor unit configuration and recruitment pattern is within normal limits.  Impression: This is an abnormal study. The findings are most consistent with the following: Bilateral median mononeuropathy at or distal to the wrist, consistent with carpal tunnel syndrome. The findings are severe in degree electrically on the right and mild in degree electrically on the left. No electrodiagnostic evidence of a right or left cervical (C5-T1) motor radiculopathy. Screening studies for right or left ulnar or radial mononeuropathies are normal.    ___________________________ Venetia Potters, MD    Nerve Conduction Studies Motor Nerve Results    Latency Amplitude F-Lat Segment Distance CV Comment  Site (ms) Norm (mV) Norm (ms)  (cm) (m/s) Norm   Left Median (APB) Motor  Wrist *4.2  < 3.9 7.2  > 6.0        Elbow 8.7 - 7.2 -  Elbow-Wrist 25.5 57  > 50   Right Median (APB) Motor  Wrist *6.4  < 3.9 6.1  > 6.0        Elbow 11.1 - 6.0 -  Elbow-Wrist 25 53  > 50    Left Ulnar (ADM) Motor  Wrist 1.88  < 3.1 12.7  > 7.0        Bel elbow 4.7 - 12.4 -  Bel elbow-Wrist 18.5 66  > 50   Ab elbow 6.4 - 12.4 -  Ab elbow-Bel elbow 10 59 -   Right Ulnar (ADM) Motor  Wrist 2.1  < 3.1 13.6  > 7.0        Bel elbow 5.1 - 13.3 -  Bel elbow-Wrist 19 63  > 50   Ab elbow 6.9 - 13.3 -  Ab elbow-Bel elbow 10 56 -    Sensory Sites    Neg Peak Lat Amplitude (O-P) Segment Distance Velocity Comment  Site (ms) Norm (V) Norm  (cm) (ms)   Left Median Sensory  Wrist-Dig II *4.1  < 3.4 25  > 20 Wrist-Dig II 13    Right Median Sensory  Wrist-Dig II *NR  < 3.4 *NR  > 20 Wrist-Dig II 13    Left Radial Sensory  Forearm-Wrist 1.73  < 2.7 32  > 18 Forearm-Wrist 10    Right Radial Sensory  Forearm-Wrist 2.0  < 2.7 30  > 18 Forearm-Wrist 10    Left Ulnar Sensory  Wrist-Dig V 2.5  < 3.1 34  > 12 Wrist-Dig V 11    Right Ulnar Sensory  Wrist-Dig V 2.6  < 3.1 34  > 12 Wrist-Dig V 11     Electromyography   Side Muscle Ins.Act Fibs Fasc Recrt Amp Dur  Poly Activation Comment  Right FDI Nml Nml Nml Nml Nml Nml Nml Nml N/A  Right EIP Nml Nml Nml Nml Nml Nml Nml Nml N/A  Right Pronator teres Nml Nml Nml Nml Nml Nml Nml Nml N/A  Right APB Nml Nml Nml Nml Nml Nml Nml Nml N/A  Right Biceps Nml Nml Nml Nml Nml Nml Nml Nml N/A  Right Triceps Nml Nml Nml Nml Nml Nml Nml Nml N/A  Right Deltoid Nml Nml Nml Nml Nml Nml Nml Nml N/A  Right C7 PSP Nml Nml Nml Nml Nml Nml Nml Nml N/A  Left FDI Nml Nml Nml Nml Nml Nml Nml Nml N/A  Left EIP Nml Nml Nml Nml Nml Nml Nml Nml N/A  Left Pronator teres Nml Nml Nml Nml Nml Nml Nml Nml N/A  Left Biceps Nml Nml Nml Nml Nml Nml Nml Nml N/A  Left Triceps Nml Nml Nml Nml Nml Nml Nml Nml N/A  Left Deltoid Nml Nml Nml Nml Nml Nml Nml Nml N/A  Left C7 PSP Nml Nml Nml Nml Nml Nml Nml Nml N/A      Waveforms:  Motor           Sensory               "
